# Patient Record
Sex: Female | Born: 1975 | Race: White | Hispanic: No | Marital: Single | Smoking: Current every day smoker
Health system: Southern US, Community
[De-identification: ages and names within clinical notes are randomized; demographics above are authoritative.]

## PROBLEM LIST (undated history)

## (undated) DIAGNOSIS — R87629 Unspecified abnormal cytological findings in specimens from vagina: Secondary | ICD-10-CM

## (undated) DIAGNOSIS — K759 Inflammatory liver disease, unspecified: Secondary | ICD-10-CM

## (undated) DIAGNOSIS — M1990 Unspecified osteoarthritis, unspecified site: Secondary | ICD-10-CM

## (undated) HISTORY — PX: CRYOTHERAPY: SHX1416

## (undated) HISTORY — DX: Unspecified osteoarthritis, unspecified site: M19.90

## (undated) HISTORY — DX: Unspecified abnormal cytological findings in specimens from vagina: R87.629

## (undated) HISTORY — PX: NECK SURGERY: SHX720

## (undated) HISTORY — PX: CLAVICLE SURGERY: SHX598

---

## 2007-01-07 ENCOUNTER — Emergency Department (HOSPITAL_COMMUNITY): Admission: EM | Admit: 2007-01-07 | Discharge: 2007-01-07 | Payer: Self-pay | Admitting: Emergency Medicine

## 2011-08-25 ENCOUNTER — Emergency Department (HOSPITAL_COMMUNITY)
Admission: EM | Admit: 2011-08-25 | Discharge: 2011-08-25 | Disposition: A | Discharge status: Home / Self Care | Payer: Self-pay | Attending: Emergency Medicine | Admitting: Emergency Medicine

## 2011-08-25 ENCOUNTER — Encounter (HOSPITAL_COMMUNITY): Payer: Self-pay | Admitting: *Deleted

## 2011-08-25 DIAGNOSIS — S81809A Unspecified open wound, unspecified lower leg, initial encounter: Secondary | ICD-10-CM | POA: Insufficient documentation

## 2011-08-25 DIAGNOSIS — S81009A Unspecified open wound, unspecified knee, initial encounter: Secondary | ICD-10-CM | POA: Insufficient documentation

## 2011-08-25 DIAGNOSIS — W278XXA Contact with other nonpowered hand tool, initial encounter: Secondary | ICD-10-CM | POA: Insufficient documentation

## 2011-08-25 DIAGNOSIS — S81812A Laceration without foreign body, left lower leg, initial encounter: Secondary | ICD-10-CM

## 2011-08-25 DIAGNOSIS — F17200 Nicotine dependence, unspecified, uncomplicated: Secondary | ICD-10-CM | POA: Insufficient documentation

## 2011-08-25 NOTE — ED Notes (Signed)
Superficial nick to skin on lt lower leg when shaving.No active bleeding at present.  Cut was over a vein.

## 2011-08-25 NOTE — ED Notes (Signed)
Cut leg while shaving today, states is will not stop bleeding

## 2011-08-25 NOTE — Discharge Instructions (Signed)
Leave the pressure dressing on leg until tomorrow.  Elevate leg as much as possible.  If you have any further bleeding apply direct pressure for 15-20 minutes or until it stops.

## 2011-08-25 NOTE — ED Provider Notes (Signed)
History     CSN: [id number]  Arrival date & time 08/25/11  1911   First MD Initiated Contact with Patient 08/25/11 2011      Chief Complaint  Patient presents with  . Extremity Laceration    (Consider location/radiation/quality/duration/timing/severity/associated sxs/prior treatment) HPI Comments: Cut her L lower leg while shaving.  Tied a rag around her leg and it eventually stopped.  Not on anticoagulants.  The history is provided by the patient. No language interpreter was used.    History reviewed. No pertinent past medical history.  History reviewed. No pertinent past surgical history.  No family history on file.  History  Substance Use Topics  . Smoking status: Current Everyday Smoker -- 2.0 packs/day  . Smokeless tobacco: Not on file  . Alcohol Use: Yes    OB History    Grav Para Term Preterm Abortions TAB SAB Ect Mult Living                  Review of Systems  Skin: Positive for wound.    Allergies  Review of patient's allergies indicates no known allergies.  Home Medications   Current Outpatient Rx  Name Route Sig Dispense Refill  . GOODY HEADACHE PO Oral Take 2 Packages by mouth as needed. For pain      BP 107/64  Pulse 77  Temp(Src) 97.5 F (36.4 C) (Oral)  Resp 20  Ht 5\' 9"  (1.753 m)  Wt 158 lb (71.668 kg)  BMI 23.33 kg/m2  SpO2 97%  Physical Exam  Nursing note and vitals reviewed. Constitutional: She is oriented to person, place, and time. She appears well-developed and well-nourished. No distress.  HENT:  Head: Normocephalic and atraumatic.  Eyes: EOM are normal.  Neck: Normal range of motion.  Cardiovascular: Normal rate, regular rhythm and normal heart sounds.   Pulmonary/Chest: Effort normal and breath sounds normal.  Abdominal: Soft. She exhibits no distension. There is no tenderness.  Musculoskeletal: Normal range of motion.       Legs: Neurological: She is alert and oriented to person, place, and time.  Skin: Skin is warm  and dry.  Psychiatric: She has a normal mood and affect. Judgment normal.    ED Course  Procedures (including critical care time)  Labs Reviewed - No data to display No results found.   1. Laceration of left lower leg       MDM  Leave pressure dressing on leg until tomorrow.  Direct pressure if new bleeding.        Worthy Rancher, PA 08/25/11 2103  Worthy Rancher, PA 08/25/11 2105

## 2011-08-26 NOTE — ED Provider Notes (Signed)
Medical screening examination/treatment/procedure(s) were performed by non-physician practitioner and as supervising physician I was immediately available for consultation/collaboration.  Donnetta Hutching, MD 08/26/11 1739

## 2012-08-08 ENCOUNTER — Ambulatory Visit: Payer: Self-pay | Admitting: Physician Assistant

## 2012-11-13 ENCOUNTER — Ambulatory Visit: Payer: Self-pay | Admitting: Physician Assistant

## 2012-11-16 ENCOUNTER — Ambulatory Visit: Payer: Self-pay | Admitting: Physician Assistant

## 2013-05-24 ENCOUNTER — Encounter: Payer: Self-pay | Admitting: Family Medicine

## 2013-05-24 ENCOUNTER — Ambulatory Visit (INDEPENDENT_AMBULATORY_CARE_PROVIDER_SITE_OTHER): Payer: Medicaid Other | Admitting: Family Medicine

## 2013-05-24 VITALS — BP 128/88 | HR 83 | Temp 99.2°F | Ht 69.0 in | Wt 162.0 lb

## 2013-05-24 DIAGNOSIS — I872 Venous insufficiency (chronic) (peripheral): Secondary | ICD-10-CM

## 2013-05-24 DIAGNOSIS — L0390 Cellulitis, unspecified: Secondary | ICD-10-CM

## 2013-05-24 DIAGNOSIS — R609 Edema, unspecified: Secondary | ICD-10-CM

## 2013-05-24 DIAGNOSIS — L0291 Cutaneous abscess, unspecified: Secondary | ICD-10-CM

## 2013-05-24 MED ORDER — SULFAMETHOXAZOLE-TMP DS 800-160 MG PO TABS: 1.0000 | ORAL_TABLET | Freq: Two times a day (BID) | ORAL | Status: DC

## 2013-05-24 NOTE — Patient Instructions (Signed)
Abscess An abscess is an infected area that contains a collection of pus and debris.It can occur in almost any part of the body. An abscess is also known as a furuncle or boil. CAUSES  An abscess occurs when tissue gets infected. This can occur from blockage of oil or sweat glands, infection of hair follicles, or a minor injury to the skin. As the body tries to fight the infection, pus collects in the area and creates pressure under the skin. This pressure causes pain. People with weakened immune systems have difficulty fighting infections and get certain abscesses more often.  SYMPTOMS Usually an abscess develops on the skin and becomes a painful mass that is red, warm, and tender. If the abscess forms under the skin, you may feel a moveable soft area under the skin. Some abscesses break open (rupture) on their own, but most will continue to get worse without care. The infection can spread deeper into the body and eventually into the bloodstream, causing you to feel ill.  DIAGNOSIS  Your caregiver will take your medical history and perform a physical exam. A sample of fluid may also be taken from the abscess to determine what is causing your infection. TREATMENT  Your caregiver may prescribe antibiotic medicines to fight the infection. However, taking antibiotics alone usually does not cure an abscess. Your caregiver may need to make a small cut (incision) in the abscess to drain the pus. In some cases, gauze is packed into the abscess to reduce pain and to continue draining the area. HOME CARE INSTRUCTIONS   Only take over-the-counter or prescription medicines for pain, discomfort, or fever as directed by your caregiver.  If you were prescribed antibiotics, take them as directed. Finish them even if you start to feel better.  If gauze is used, follow your caregiver's directions for changing the gauze.  To avoid spreading the infection:  Keep your draining abscess covered with a  bandage.  Wash your hands well.  Do not share personal care items, towels, or whirlpools with others.  Avoid skin contact with others.  Keep your skin and clothes clean around the abscess.  Keep all follow-up appointments as directed by your caregiver. SEEK MEDICAL CARE IF:   You have increased pain, swelling, redness, fluid drainage, or bleeding.  You have muscle aches, chills, or a general ill feeling.  You have a fever. MAKE SURE YOU:   Understand these instructions.  Will watch your condition.  Will get help right away if you are not doing well or get worse. Document Released: 02/10/2005 Document Revised: 11/02/2011 Document Reviewed: 07/16/2011 Exit[employer] Patient Information 2014 [employer].  

## 2013-05-24 NOTE — Progress Notes (Signed)
   Subjective:    Patient ID: Denise MannersHeather Conner, female    DOB: 09/09/1975, 38 y.o.   MRN: [id number]  HPI  This 38 y.o. female presents for evaluation of left leg abscess and lower extremity edema. She is having some discomfort in her lower extremities and she is having problems with varicose Veins and swelling.  She states that her legs get swollen during the day and turn red.  She smokes 2 ppd of cigarettes.  Review of Systems C/o lower extremity edema and left leg abscess. No chest pain, SOB, HA, dizziness, vision change, N/V, diarrhea, constipation, dysuria, urinary urgency or frequency, myalgias, arthralgias or rash.     Objective:   Physical Exam  Vital signs noted  Well developed well nourished female.  HEENT - Head atraumatic Normocephalic                Eyes - PERRLA, Conjuctiva - clear Sclera- Clear EOMI                Ears - EAC's Wnl TM's Wnl Gross Hearing WNL                Nose - Nares patent                 Throat - oropharanx wnl Respiratory - Lungs CTA bilateral Cardiac - RRR S1 and S2 without murmur GI - Abdomen soft Nontender and bowel sounds active x 4 Extremities - Venous stasis dermatitis and varicose veins Neuro - Grossly intact. Skin - Left distal inner thigh proximal to her left knee with erythema and approximately 6cm induration W/o fluctuance. She has tenderness in this region.  She has bandaids which have purulent drainage On them.     Assessment & Plan:  Edema - Plan: Ambulatory referral to Vascular Surgery, CANCELED: COMPRESSION STOCKINGS-MEASURE,APPLY AND TEACH  Chronic venous insufficiency - Plan: Ambulatory referral to Vascular Surgery, CANCELED: COMPRESSION STOCKINGS-MEASURE,APPLY AND TEACH  Abscess - Plan: sulfamethoxazole-trimethoprim (BACTRIM DS) 800-160 MG per tablet bid x 14 days And follow up if not better.  Tobacco abuse - Discussed she needs to cut back and quit and this is not helping her legs.  Deatra CanterWilliam J Vashti Bolanos FNP

## 2013-05-29 ENCOUNTER — Other Ambulatory Visit: Payer: Self-pay | Admitting: *Deleted

## 2013-05-29 ENCOUNTER — Ambulatory Visit: Payer: Medicaid Other | Admitting: Family Medicine

## 2013-05-29 DIAGNOSIS — L539 Erythematous condition, unspecified: Secondary | ICD-10-CM

## 2013-05-29 DIAGNOSIS — M7989 Other specified soft tissue disorders: Secondary | ICD-10-CM

## 2013-06-01 ENCOUNTER — Ambulatory Visit: Payer: Medicaid Other | Admitting: Family Medicine

## 2013-06-04 ENCOUNTER — Ambulatory Visit: Payer: Medicaid Other | Admitting: General Practice

## 2013-06-27 ENCOUNTER — Telehealth: Payer: Self-pay | Admitting: Family Medicine

## 2013-06-28 ENCOUNTER — Encounter: Payer: Self-pay | Admitting: Vascular Surgery

## 2013-06-29 ENCOUNTER — Inpatient Hospital Stay (HOSPITAL_COMMUNITY): Admission: RE | Admit: 2013-06-29 | Payer: Self-pay | Source: Ambulatory Visit

## 2013-06-29 ENCOUNTER — Encounter: Payer: Self-pay | Admitting: Vascular Surgery

## 2013-07-23 ENCOUNTER — Other Ambulatory Visit: Payer: Medicaid Other | Admitting: Nurse Practitioner

## 2013-08-13 ENCOUNTER — Telehealth: Payer: Self-pay | Admitting: General Practice

## 2013-10-03 ENCOUNTER — Encounter: Payer: Self-pay | Admitting: Physician Assistant

## 2013-10-03 ENCOUNTER — Ambulatory Visit (INDEPENDENT_AMBULATORY_CARE_PROVIDER_SITE_OTHER): Payer: Medicaid Other | Admitting: Physician Assistant

## 2013-10-03 VITALS — BP 125/83 | HR 78 | Temp 98.0°F | Ht 69.0 in | Wt 158.0 lb

## 2013-10-03 DIAGNOSIS — I8390 Asymptomatic varicose veins of unspecified lower extremity: Secondary | ICD-10-CM | POA: Insufficient documentation

## 2013-10-03 NOTE — Progress Notes (Signed)
Subjective:     Patient ID: Joetta MannersHeather Stevick, female   DOB: 07/17/1975, 38 y.o.   MRN: [id number]  HPI Pt here for f/u of chronic problem of lower ext edema and varicose vein Prev seen for same and referral made Due to illness or family member was unable to make appt  Review of Systems + pain and swelling to the bilat lower ext Sx worse with prolonged standing Denies numbness     Objective:   Physical Exam Mult varicosities noted to the lower ext bilat No erythema/edema noted today Sl TTP over some of the prom veins Good pulses/sensory    Assessment:     Lower ext edema Varicose veins    Plan:     Referral to vein spec Stressed importance of making appt OTC compression hose when at work F/U prn

## 2013-10-25 ENCOUNTER — Encounter: Payer: Self-pay | Admitting: General Practice

## 2013-10-25 ENCOUNTER — Ambulatory Visit (INDEPENDENT_AMBULATORY_CARE_PROVIDER_SITE_OTHER): Payer: Medicaid Other

## 2013-10-25 ENCOUNTER — Ambulatory Visit (INDEPENDENT_AMBULATORY_CARE_PROVIDER_SITE_OTHER): Payer: Medicaid Other | Admitting: General Practice

## 2013-10-25 DIAGNOSIS — R079 Chest pain, unspecified: Secondary | ICD-10-CM

## 2013-10-25 DIAGNOSIS — R0781 Pleurodynia: Secondary | ICD-10-CM
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
## 2013-10-27 ENCOUNTER — Other Ambulatory Visit: Payer: Self-pay | Admitting: General Practice

## 2013-10-27 DIAGNOSIS — S2231XA Fracture of one rib, right side, initial encounter for closed fracture: Secondary | ICD-10-CM

## 2013-10-27 DIAGNOSIS — S2242XA Multiple fractures of ribs, left side, initial encounter for closed fracture: Secondary | ICD-10-CM

## 2013-10-27 MED ORDER — HYDROCODONE-ACETAMINOPHEN 5-325 MG PO TABS: 1.0000 | ORAL_TABLET | Freq: Four times a day (QID) | ORAL | Status: DC | PRN

## 2013-12-06 ENCOUNTER — Encounter: Payer: Self-pay | Admitting: Vascular Surgery

## 2013-12-07 ENCOUNTER — Encounter: Payer: Self-pay | Admitting: Vascular Surgery

## 2013-12-07 ENCOUNTER — Encounter (HOSPITAL_COMMUNITY): Payer: Self-pay

## 2013-12-17 ENCOUNTER — Ambulatory Visit: Payer: Medicaid Other | Admitting: Family Medicine

## 2014-07-16 IMAGING — CR DG RIBS 2V*L*
2 series · 2 of 2 positions shown · non-contrast
Comparison: None.

CLINICAL DATA: Fall.  Left lower rib pain.

LEFT RIBS - 2 VIEW

[view not recorded (1 of 2)]
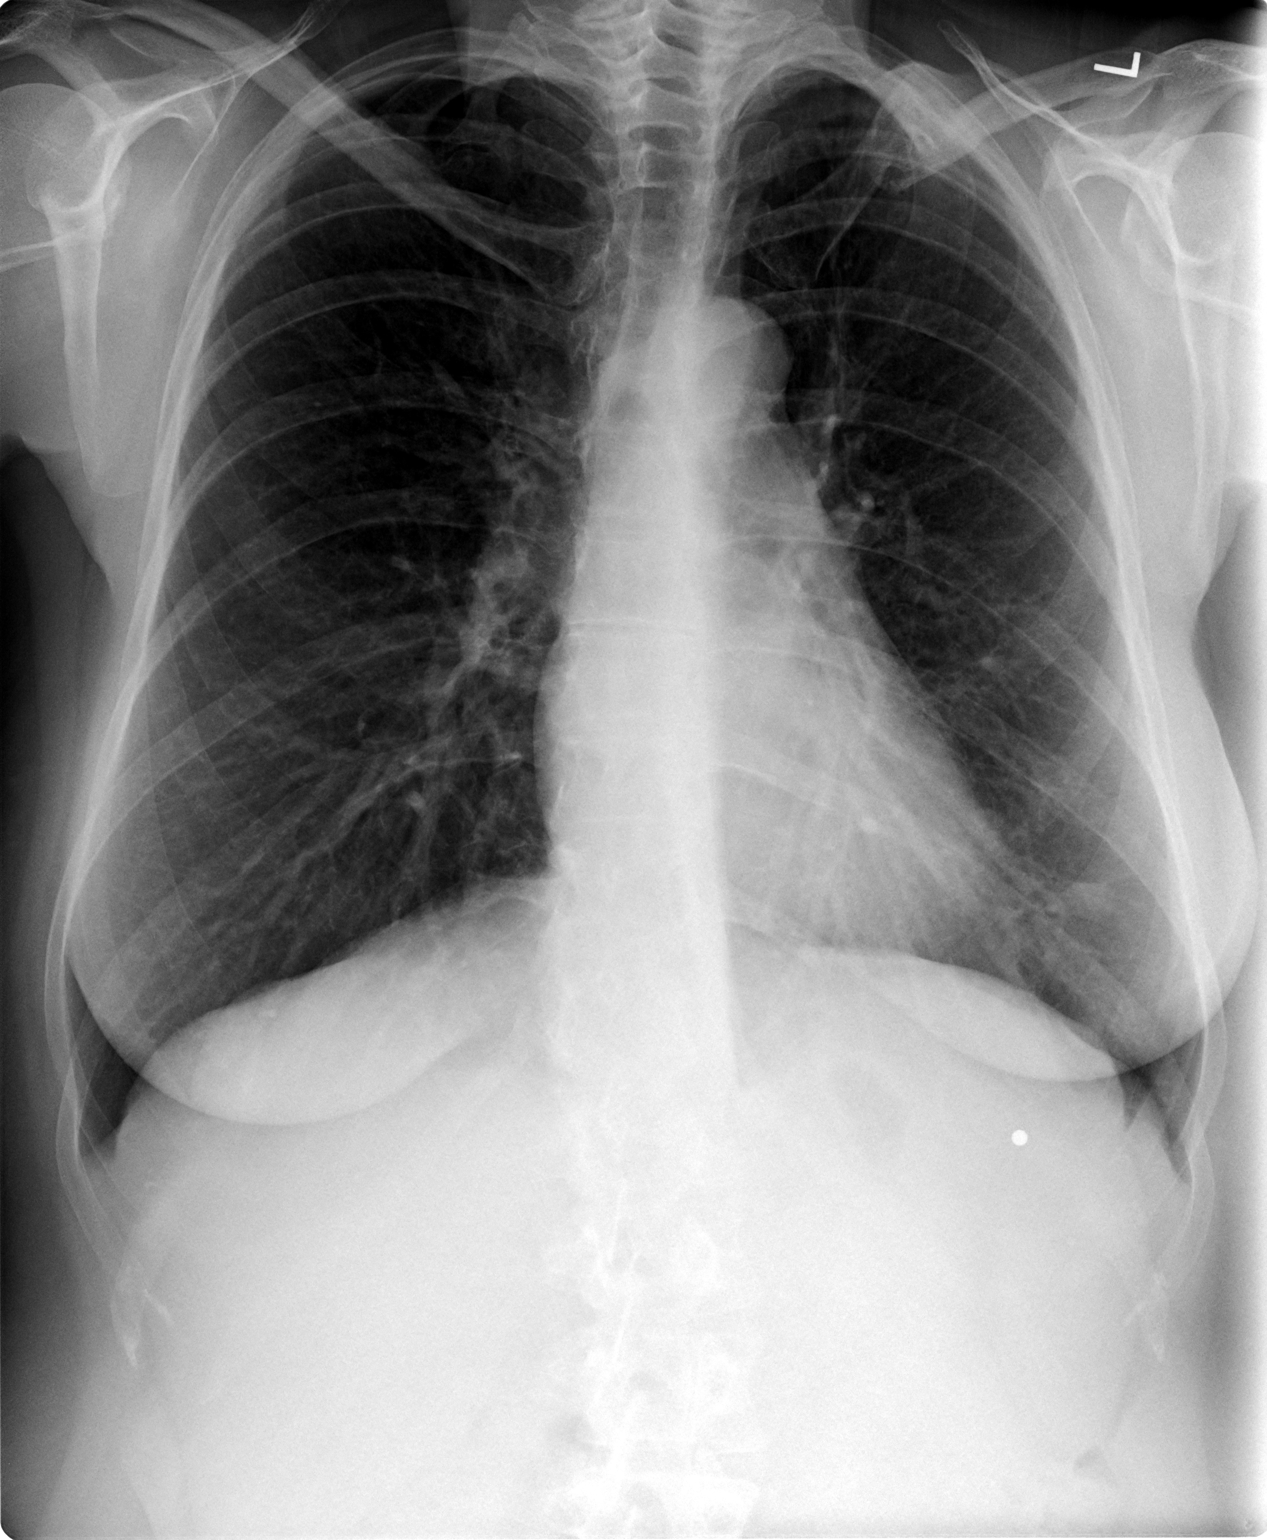

[view not recorded (2 of 2)]
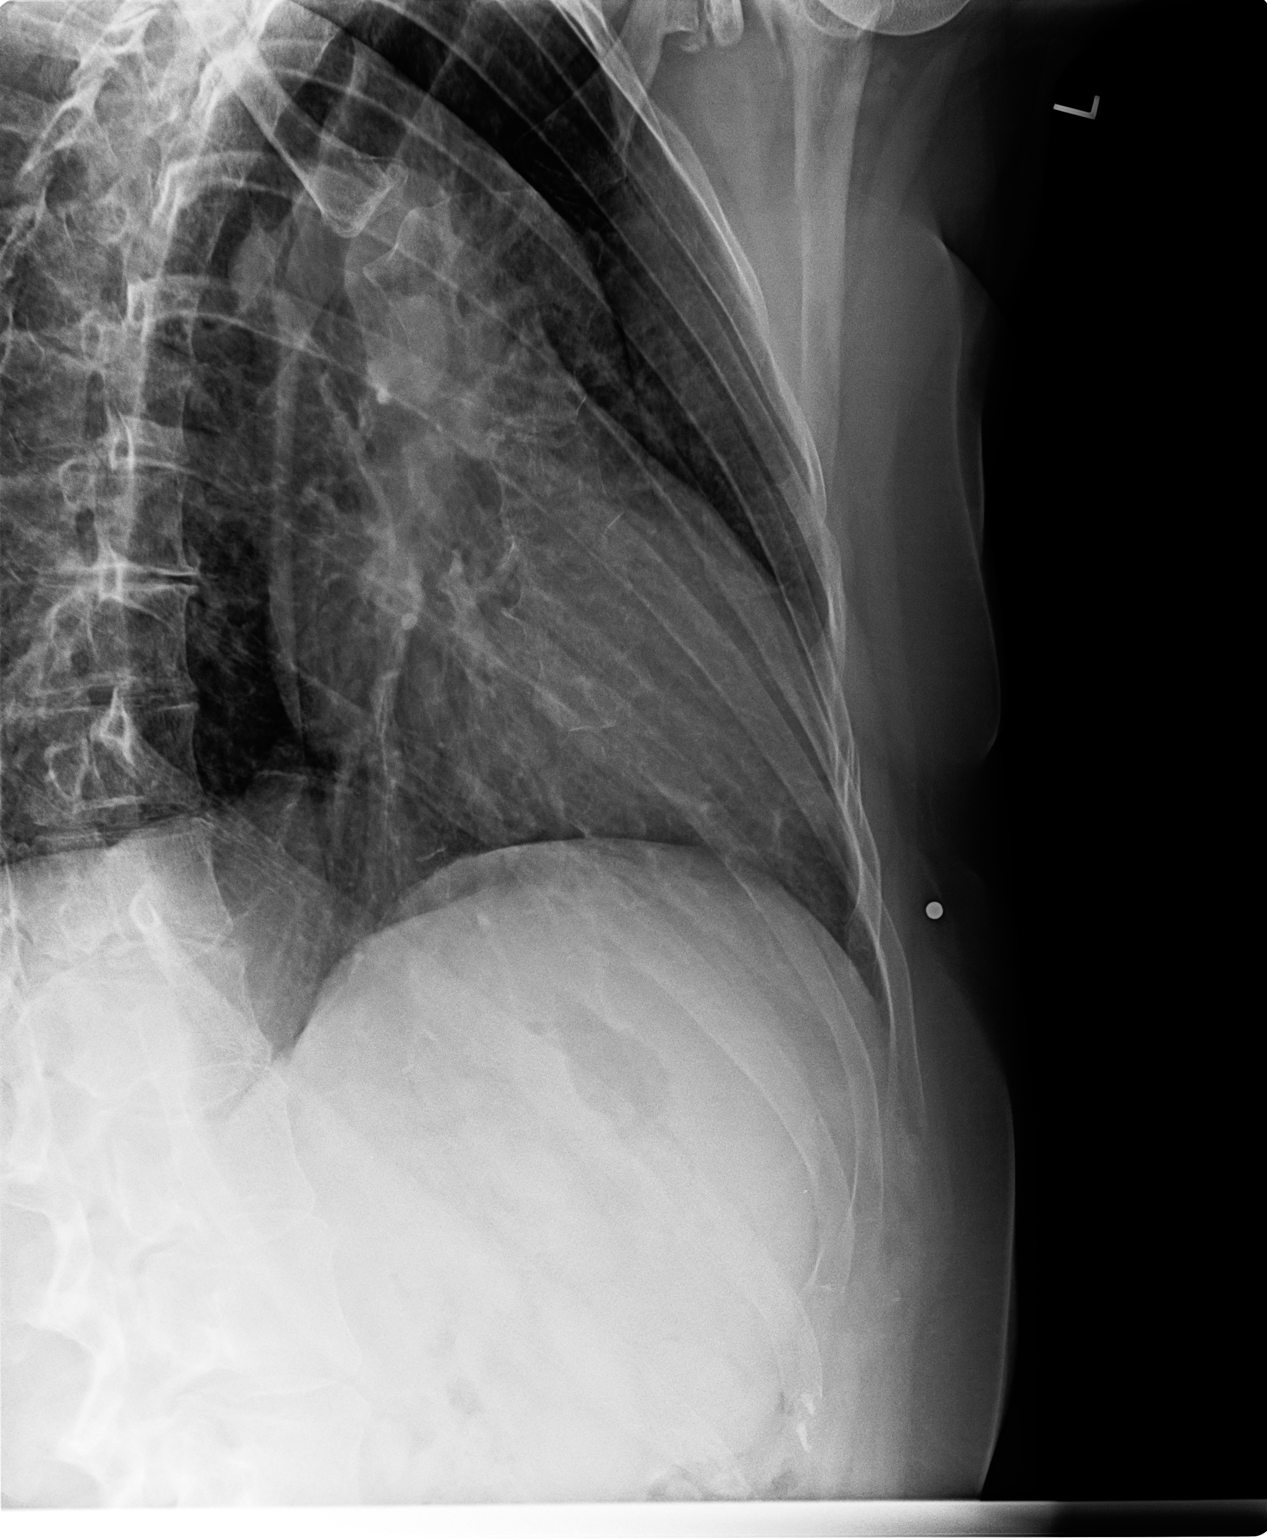

[2 of 2 positions shown; findings below may reference images not displayed]

FINDINGS: The heart, mediastinal, hilar contours are normal.  No
focal airspace opacity, effusion, or pneumothorax is identified.

A metallic skin marker denotes site of patient pain.  There is an
acute fracture of the lateral arc of a single left rib. This is
visualized on the oblique view.  This is favored to be the left
fifth rib.

A right cervical rib is noted.  There is a remote healed left mid
clavicle fracture.
IMPRESSION: 1.  Acute fracture of the lateral arc of the left rib (likely the
fifth rib).
2.  Right cervical rib.
3.  Remote healed left clavicle fracture.

## 2014-08-16 ENCOUNTER — Telehealth: Payer: Self-pay | Admitting: Family Medicine

## 2014-08-22 NOTE — Telephone Encounter (Signed)
Several attempts have been made to contact patient. Detailed message left to call us back if still needs us but if not she may disregard this message.

## 2015-12-25 IMAGING — CR DG RIBS W/ CHEST 3+V*L*
2 series · 2 of 2 positions shown · non-contrast
Comparison: none

[view not recorded (1 of 2)]
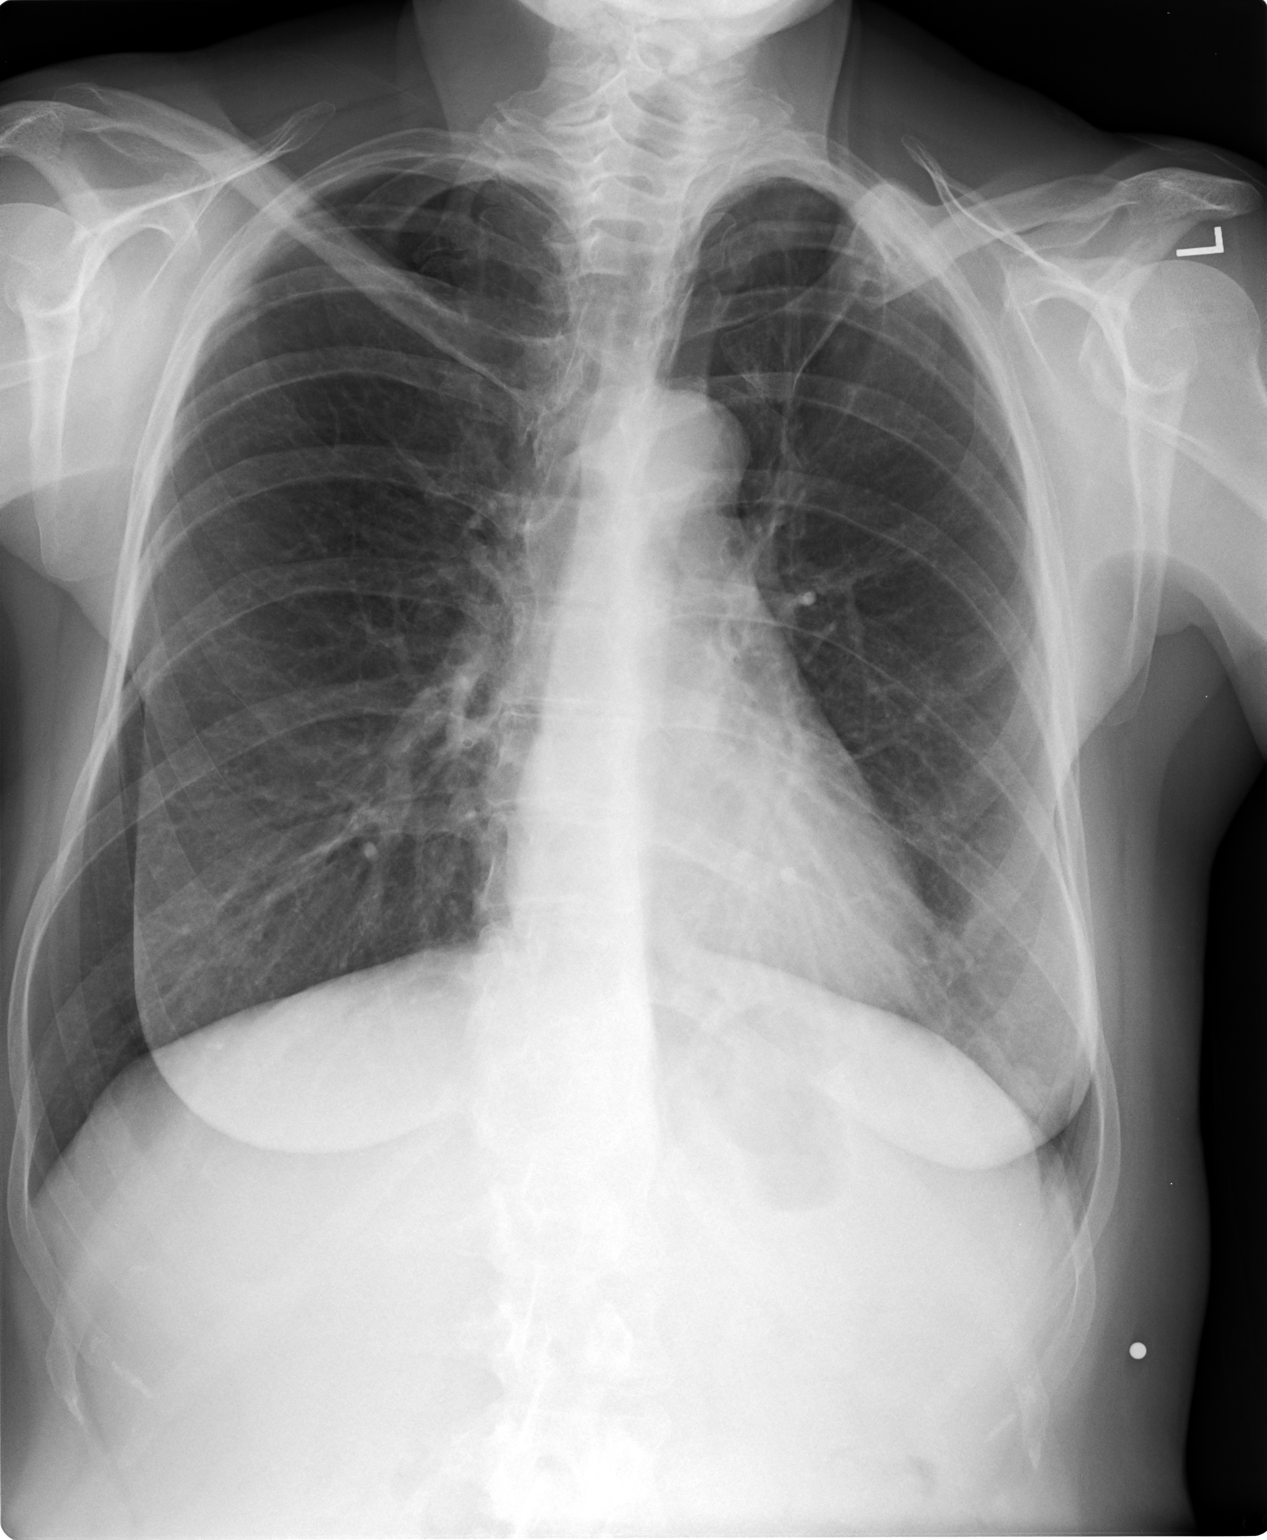

[view not recorded (2 of 2)]
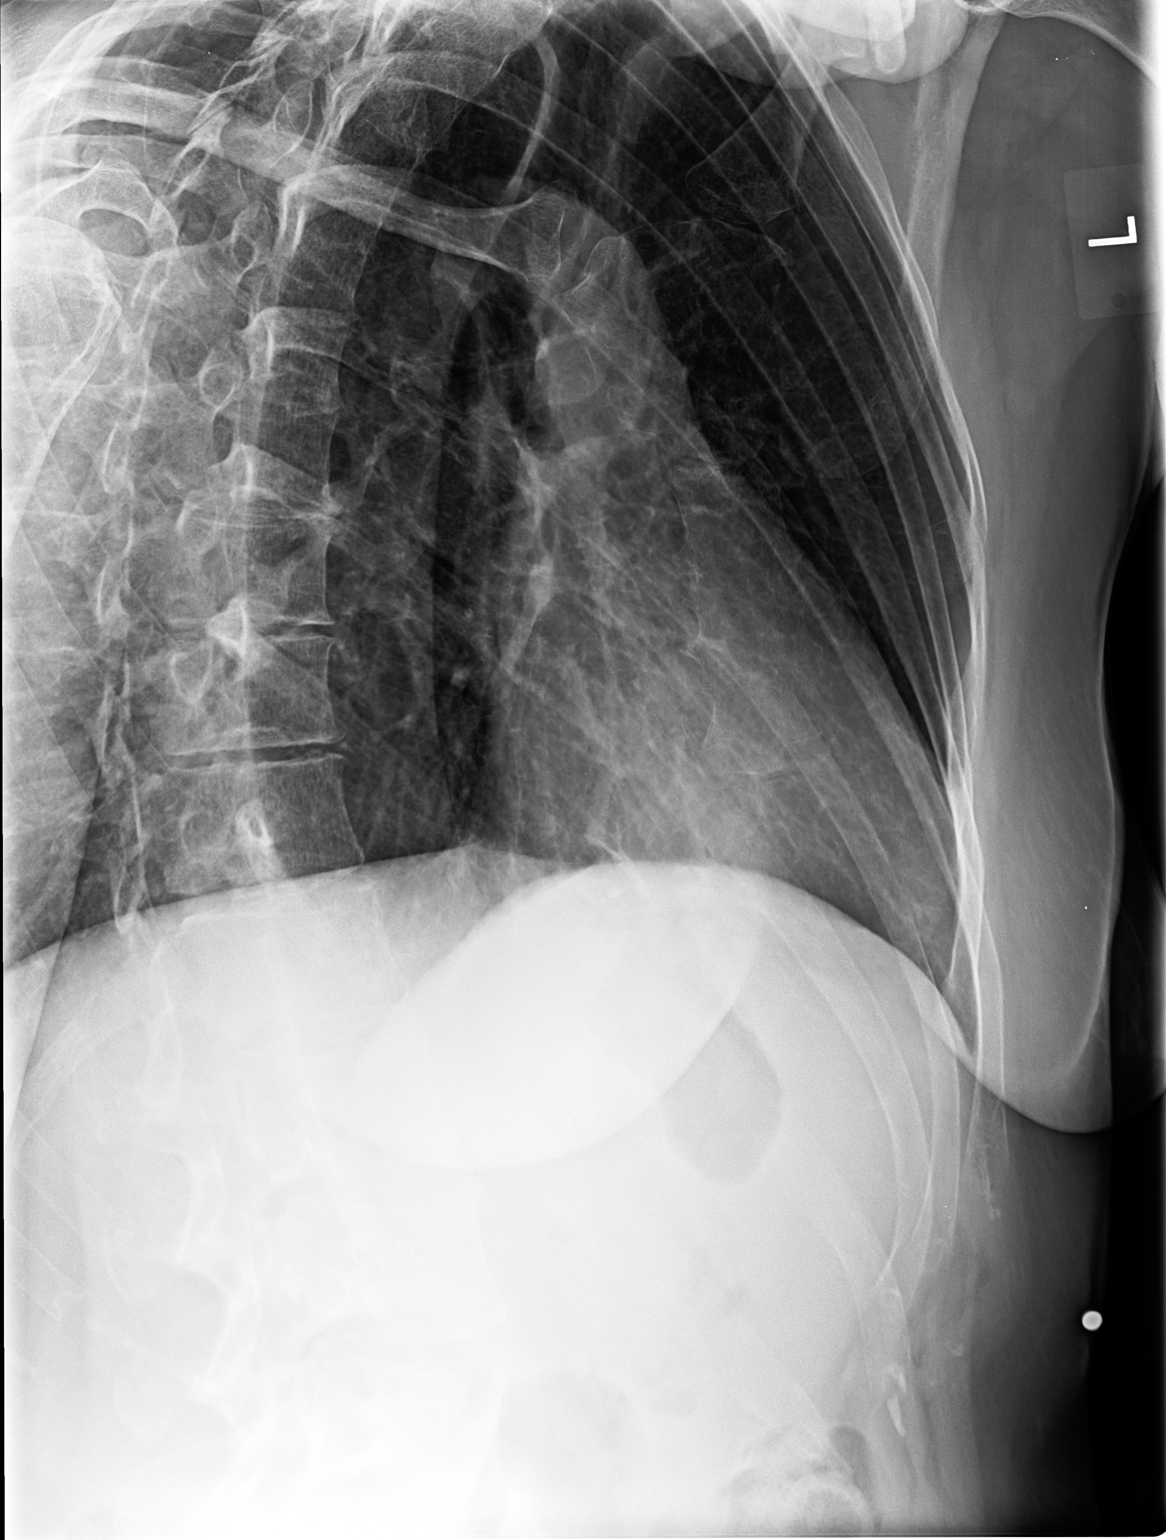

[2 of 2 positions shown; findings below may reference images not displayed]

Canned report from images found in remote index.

Refer to host system for actual result text.

## 2016-11-15 DIAGNOSIS — F329 Major depressive disorder, single episode, unspecified: Secondary | ICD-10-CM | POA: Insufficient documentation

## 2019-09-12 ENCOUNTER — Ambulatory Visit: Payer: Self-pay

## 2019-10-10 ENCOUNTER — Ambulatory Visit: Payer: Self-pay | Attending: Internal Medicine

## 2019-10-10 DIAGNOSIS — Z23 Encounter for immunization: Secondary | ICD-10-CM

## 2019-10-10 NOTE — Progress Notes (Signed)
   Covid-19 Vaccination Clinic  Name:  Denise Conner    MRN: [id number] DOB: 01-Jun-1975  10/10/2019  [name] was observed post Covid-19 immunization for 15 minutes without incident. She was provided with Vaccine Information Sheet and instruction to access the V-Safe system.   [name] was instructed to call 911 with any severe reactions post vaccine: Marland Kitchen Difficulty breathing  . Swelling of face and throat  . A fast heartbeat  . A bad rash all over body  . Dizziness and weakness   Immunizations Administered    Name Date Dose VIS Date Route   Moderna COVID-19 Vaccine 10/10/2019  1:09 PM 0.5 mL 04/2019 Intramuscular   Manufacturer: Gala Murdoch   Lot: [id number]   NDC: [id number]

## 2020-03-04 ENCOUNTER — Other Ambulatory Visit: Payer: Self-pay

## 2020-03-06 ENCOUNTER — Ambulatory Visit (INDEPENDENT_AMBULATORY_CARE_PROVIDER_SITE_OTHER): Payer: Medicaid Other | Admitting: Adult Health

## 2020-03-06 ENCOUNTER — Encounter: Payer: Self-pay | Admitting: Adult Health

## 2020-03-06 VITALS — BP 130/81 | HR 81 | Ht 67.5 in | Wt 183.5 lb

## 2020-03-06 DIAGNOSIS — O09529 Supervision of elderly multigravida, unspecified trimester: Secondary | ICD-10-CM | POA: Insufficient documentation

## 2020-03-06 DIAGNOSIS — O0932 Supervision of pregnancy with insufficient antenatal care, second trimester: Secondary | ICD-10-CM | POA: Diagnosis not present

## 2020-03-06 DIAGNOSIS — Z3201 Encounter for pregnancy test, result positive: Secondary | ICD-10-CM | POA: Insufficient documentation

## 2020-03-06 DIAGNOSIS — O09522 Supervision of elderly multigravida, second trimester: Secondary | ICD-10-CM | POA: Diagnosis not present

## 2020-03-06 DIAGNOSIS — F1110 Opioid abuse, uncomplicated: Secondary | ICD-10-CM

## 2020-03-06 DIAGNOSIS — Z363 Encounter for antenatal screening for malformations: Secondary | ICD-10-CM | POA: Diagnosis not present

## 2020-03-06 DIAGNOSIS — F17200 Nicotine dependence, unspecified, uncomplicated: Secondary | ICD-10-CM | POA: Insufficient documentation

## 2020-03-06 LAB — POCT URINE PREGNANCY: Preg Test, Ur: POSITIVE — AB
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
## 2020-03-13 ENCOUNTER — Other Ambulatory Visit: Payer: Self-pay | Admitting: Adult Health

## 2020-03-13 ENCOUNTER — Ambulatory Visit (INDEPENDENT_AMBULATORY_CARE_PROVIDER_SITE_OTHER): Payer: Medicaid Other

## 2020-03-13 DIAGNOSIS — Z363 Encounter for antenatal screening for malformations: Secondary | ICD-10-CM

## 2020-03-13 DIAGNOSIS — Z3A22 22 weeks gestation of pregnancy: Secondary | ICD-10-CM

## 2020-03-13 DIAGNOSIS — O4402 Placenta previa specified as without hemorrhage, second trimester: Secondary | ICD-10-CM | POA: Diagnosis not present

## 2020-03-13 NOTE — Progress Notes (Signed)
[place] TA/TV: 26+1 wks,single IUP,cephalic,svp of fluid 5.2 CM,CX 3.6 cm,complete placenta previa,posterior placenta gr 0,normal ovaries,fhr 117 bpm,EFW 821 g,anatomy complete,EDD 06/18/2020 by today's ultrasound,discussed results with Fran,pt will come back next wk for doctors appt, pelvic rest,per Drenda Freeze.

## 2020-03-19 ENCOUNTER — Encounter: Payer: Self-pay | Admitting: Women's Health

## 2020-03-19 DIAGNOSIS — F1120 Opioid dependence, uncomplicated: Secondary | ICD-10-CM | POA: Insufficient documentation

## 2020-03-19 DIAGNOSIS — O0990 Supervision of high risk pregnancy, unspecified, unspecified trimester: Secondary | ICD-10-CM | POA: Insufficient documentation

## 2020-03-19 DIAGNOSIS — O26899 Other specified pregnancy related conditions, unspecified trimester: Secondary | ICD-10-CM | POA: Insufficient documentation

## 2020-03-19 DIAGNOSIS — Z6791 Unspecified blood type, Rh negative: Secondary | ICD-10-CM | POA: Insufficient documentation

## 2020-03-21 ENCOUNTER — Encounter: Payer: Self-pay | Admitting: Women's Health

## 2020-03-21 ENCOUNTER — Ambulatory Visit (INDEPENDENT_AMBULATORY_CARE_PROVIDER_SITE_OTHER): Payer: Medicaid Other | Admitting: Women's Health

## 2020-03-21 VITALS — BP 116/74 | HR 78 | Wt 187.0 lb

## 2020-03-21 DIAGNOSIS — O09522 Supervision of elderly multigravida, second trimester: Secondary | ICD-10-CM

## 2020-03-21 DIAGNOSIS — Z23 Encounter for immunization: Secondary | ICD-10-CM | POA: Diagnosis not present

## 2020-03-21 DIAGNOSIS — O2342 Unspecified infection of urinary tract in pregnancy, second trimester: Secondary | ICD-10-CM

## 2020-03-21 DIAGNOSIS — O0992 Supervision of high risk pregnancy, unspecified, second trimester: Secondary | ICD-10-CM | POA: Diagnosis not present

## 2020-03-21 DIAGNOSIS — O99320 Drug use complicating pregnancy, unspecified trimester: Secondary | ICD-10-CM

## 2020-03-21 DIAGNOSIS — O4402 Placenta previa specified as without hemorrhage, second trimester: Secondary | ICD-10-CM

## 2020-03-21 DIAGNOSIS — Z6791 Unspecified blood type, Rh negative: Secondary | ICD-10-CM

## 2020-03-21 DIAGNOSIS — O4400 Placenta previa specified as without hemorrhage, unspecified trimester: Secondary | ICD-10-CM | POA: Insufficient documentation

## 2020-03-21 DIAGNOSIS — F1110 Opioid abuse, uncomplicated: Secondary | ICD-10-CM

## 2020-03-21 DIAGNOSIS — Z1389 Encounter for screening for other disorder: Secondary | ICD-10-CM

## 2020-03-21 DIAGNOSIS — F17200 Nicotine dependence, unspecified, uncomplicated: Secondary | ICD-10-CM

## 2020-03-21 DIAGNOSIS — O26899 Other specified pregnancy related conditions, unspecified trimester: Secondary | ICD-10-CM

## 2020-03-21 DIAGNOSIS — Z331 Pregnant state, incidental: Secondary | ICD-10-CM

## 2020-03-21 DIAGNOSIS — O0932 Supervision of pregnancy with insufficient antenatal care, second trimester: Secondary | ICD-10-CM

## 2020-03-21 DIAGNOSIS — F1120 Opioid dependence, uncomplicated: Secondary | ICD-10-CM

## 2020-03-21 DIAGNOSIS — Z3A24 24 weeks gestation of pregnancy: Secondary | ICD-10-CM

## 2020-03-21 DIAGNOSIS — Z98891 History of uterine scar from previous surgery: Secondary | ICD-10-CM

## 2020-03-21 LAB — POCT URINALYSIS DIPSTICK OB
Blood, UA: NEGATIVE
Glucose, UA: NEGATIVE
Ketones, UA: NEGATIVE
Nitrite, UA: POSITIVE
POC,PROTEIN,UA: NEGATIVE

## 2020-03-21 MED ORDER — NITROFURANTOIN MONOHYD MACRO 100 MG PO CAPS: 100.0000 mg | ORAL_CAPSULE | Freq: Two times a day (BID) | ORAL | 0 refills | Status: DC

## 2020-03-21 MED ORDER — BLOOD PRESSURE MONITOR MISC: 0 refills | Status: DC

## 2020-03-21 NOTE — Progress Notes (Signed)
INITIAL OBSTETRICAL VISIT Patient name: Denise Conner MRN [id number]  Date of birth: April 29, 1976 Chief Complaint:   Initial Prenatal Visit (Right lower back pain, cramping)  History of Present Illness:   Denise Conner is a 44 y.o. G18P2012 Caucasian female at [redacted]w[redacted]d by [place] at 65 weeks with an Estimated Date of Delivery: 06/18/20 being seen today for her initial obstetrical visit.   Her obstetrical history is significant for SVB x 1, then scheduled PCS @ 37wks (76yrs ago) for oligo/FGR, AB x 1 Chronic headaches> was taking 4 BC powders daily, has cut down to 2/day Suboxone 2 strips/day- off street Oxycodone 10mg  3-4/day- off street (if can't get suboxone) Smoker: 2ppd prior to pregnancy, now 1ppd, wants to quit Complete posterior previa dx @ 26wk u/s, no bleeding Today she reports urinary frequency, no fever/chills, some Rt lower back pain, afebrile.  Depression screen PHQ 2/9 03/21/2020  Decreased Interest 1  Down, Depressed, Hopeless 0  PHQ - 2 Score 1  Altered sleeping 2  Tired, decreased energy 3  Change in appetite 0  Feeling bad or failure about yourself  0  Trouble concentrating 1  Moving slowly or fidgety/restless 0  Suicidal thoughts 0  PHQ-9 Score 7    No LMP recorded (lmp unknown). Patient is pregnant. Last pap 10/18/17. Results were: normal Review of Systems:   Pertinent items are noted in HPI Denies cramping/contractions, leakage of fluid, vaginal bleeding, abnormal vaginal discharge w/ itching/odor/irritation, headaches, visual changes, shortness of breath, chest pain, abdominal pain, severe nausea/vomiting, or problems with urination or bowel movements unless otherwise stated above.  Pertinent History Reviewed:  Reviewed past medical,surgical, social, obstetrical and family history.  Reviewed problem list, medications and allergies. OB History  Gravida Para Term Preterm AB Living  4 2 2   1 2   SAB TAB Ectopic Multiple Live Births          2    # Outcome Date GA Lbr  Len/2nd Weight Sex Delivery Anes PTL Lv  4 Current           3 AB 2020          2 Term 01/13/18 [redacted]w[redacted]d  4 lb 2 oz (1.871 kg) M CS-LTranv  N LIV     Complications: IUGR (intrauterine growth retardation) of newborn  1 Term 12/15/00 [redacted]w[redacted]d  8 lb 2 oz (3.685 kg) M Vag-Spont EPI N LIV   Physical Assessment:   Vitals:   03/21/20 0912  BP: 116/74  Pulse: 78  Weight: 187 lb (84.8 kg)  Body mass index is 28.86 kg/m.       Physical Examination:  General appearance - well appearing, and in no distress  Mental status - alert, oriented to person, place, and time  Psych:  She has a normal mood and affect  Skin - warm and dry, normal color, no suspicious lesions noted  Chest - effort normal, all lung fields clear to auscultation bilaterally  Heart - normal rate and regular rhythm  Abdomen - soft, nontender  Extremities:  No swelling or varicosities noted  Thin prep pap is not done   Chaperone: N/A    TODAY'S FH: 26cm FHT: 127 via doppler  Results for orders placed or performed in visit on 03/21/20 (from the past 24 hour(s))  POC Urinalysis Dipstick OB   Collection Time: 03/21/20  9:12 AM  Result Value Ref Range   Color, UA     Clarity, UA     Glucose, UA Negative Negative  Bilirubin, UA     Ketones, UA neg    Spec Grav, UA     Blood, UA neg    pH, UA     POC,PROTEIN,UA Negative Negative, Trace, Small (1+), Moderate (2+), Large (3+), 4+   Urobilinogen, UA     Nitrite, UA positive    Leukocytes, UA Moderate (2+) (A) Negative   Appearance     Odor      Assessment & Plan:  1) High-Risk Pregnancy [id number] at [redacted]w[redacted]d with an Estimated Date of Delivery: 06/18/20   2) Initial OB visit  3) AMA 44yo  4) Late prenatal care  5) Complete posterior previa> @ 26wks, no bleeding, pelvic rest, reviewed warning s/s, reasons to seek care  5) Suboxone and oxycodone off street> gave #s to local clinics, call today   6) ASA use> was taking 4 BC powders daily, now 2/daily- cut out completely,  then will start baby ASA  7) Chronic headaches> gave printed prevention/relief measures   8) Prev c/s> SVB then scheduled C/S, wants RCS  9) Smoker> Smokes 1pp/day down from 2ppd, counseled x 3-30mins, advised cessation, discussed risks to fetus while pregnant, to infant pp, and to herself. Offered QuitlineNC, accepted, referral sent.     10) Wants BTL> reviewed risks/benefits, LARCs just as effective, consent signed today   11) UTI> rx macrobid, send cx  Meds:  Meds ordered this encounter  Medications  . Blood Pressure Monitor MISC    Sig: For regular home bp monitoring during pregnancy    Dispense:  1 each    Refill:  0    O09.90  . nitrofurantoin, macrocrystal-monohydrate, (MACROBID) 100 MG capsule    Sig: Take 1 capsule (100 mg total) by mouth 2 (two) times daily. X 7 days    Dispense:  14 capsule    Refill:  0    Order Specific Question:   Supervising Provider    Answer:   Duane Lope H [[id number]]    Initial labs obtained Continue prenatal vitamins Reviewed n/v relief measures and warning s/s to report Reviewed recommended weight gain based on pre-gravid BMI Encouraged well-balanced diet Genetic & carrier screening discussed: requests Panorama and Horizon 14 , too late for NT/IT and AFP Ultrasound discussed; fetal survey: results reviewed [facility]NC completed> form faxed if has or is planning to apply for medicaid The nature of [facility] [employer] with multiple MDs and other Advanced Practice Providers was explained to patient; also emphasized that fellows, residents, and students are part of our team. Does not have home bp cuff. Rx faxed to [facility]. Check bp weekly, let us know if >140/90.  Flu & tdap shots today  Follow-up: Return for ASAP PN2 (no visit), then 3wks EFW u/s and HROB w/ MD only, Sign BTL consent today.   Orders Placed This Encounter  Procedures  . Urine Culture  . GC/Chlamydia Probe Amp  . Tdap vaccine greater than or equal to 7yo IM  .  Flu Vaccine QUAD 36+ mos IM  . Genetic Screening  . Pain Management Screening Profile (10S)  . CBC/D/Plt+RPR+Rh+ABO+Rub Ab...  . POC Urinalysis Dipstick OB    [name] CNM, [facility] 03/21/2020 10:40 AM

## 2020-03-21 NOTE — Patient Instructions (Addendum)
Denise Conner, I greatly value your feedback.  If you receive a survey following your visit with [place] today, we appreciate you taking the time to fill it out.  Thanks, Denise Conner, CNM, WHNP-BC   You will have your sugar test next visit.  Please do not eat or drink anything after midnight the night before you come, not even water.  You will be here for at least two hours.  Please make an appointment online for the bloodwork at [email or web address] for 8:30am (or as close to this as possible). Make sure you select the [facility] service center. The day of the appointment, check in with our office first, then you will go to [facility] to start the sugar test.    [facility] [place], [place] [address]) Entrance C, located off of [address] [employer] [address] valet parking   [facility]  . [employer] [place]   [address] [place], [phone]  . [facility], Dr. Cathey Endow  [address] [place] (beside [address]) [phone]  . [facility]  [address] [place], [phone]  For Headaches:   Stay well hydrated, drink enough water so that your urine is clear, sometimes if you are dehydrated you can get headaches  Eat small frequent meals and snacks, sometimes if you are hungry you can get headaches  Sometimes you get headaches during pregnancy from the pregnancy hormones  You can try tylenol (1-2 regular strength 325mg  or 1-2 extra strength 500mg ) as directed on the box. The least amount of medication that works is best.   Cool compresses (cool wet washcloth or ice pack) to area of head that is hurting  You can also try drinking a caffeinated drink to see if this will help  If not helping, try below:  For Prevention of Headaches/Migraines:  CoQ10 100mg  three times daily  Vitamin B2 400mg  daily  Magnesium Oxide 400-600mg  daily  If You Get a Bad  Headache/Migraine:  Benadryl 25mg    Magnesium Oxide  1 large Gatorade  2 extra strength Tylenol (1,000mg  total)  1 cup coffee or Coke  If this doesn't help please call @ [phone]     Go to [email or web address] to register for FREE online childbirth classes   Call the office [phone]) or go to [facility] if:  You begin to have strong, frequent contractions  Your water breaks.  Sometimes it is a big gush of fluid, sometimes it is just a trickle that keeps getting your panties wet or running down your legs  You have vaginal bleeding.  It is normal to have a small amount of spotting if your cervix was checked.   You don't feel your baby moving like normal.  If you don't, get you something to eat and drink and lay down and focus on feeling your baby move.   If your baby is still not moving like normal, you should call the office or go to [facility].  [place] Pediatricians/Family Doctors:  [phone] Pediatrics [phone]            [facility] [phone]                 [facility] [phone] (usually not accepting new patients unless you have family there already, you are always welcome to call and ask)       [facility] Department [phone]       [facility]  Pediatricians/Family Doctors:   [facility]: [phone]  [facility]: [phone]  [facility]: [phone]  [facility] Doctors:   [facility]: [phone]   [facility]: [phone]  [facility] Doctors:  [facility]: [phone]   Home Blood Pressure Monitoring for Patients   Your provider has recommended that you check your blood pressure (BP) at least once a week at home. If you do not have a blood pressure cuff at home, one will be provided for you. Contact your provider if you have not received your monitor within 1 week.    Helpful Tips for Accurate Home Blood Pressure Checks  . Don't smoke, exercise, or drink caffeine 30 minutes before checking your BP . Use the restroom before checking your BP (a full bladder can raise your pressure) . Relax in a comfortable upright chair . Feet on the ground . Left arm resting comfortably on a flat surface at the level of your heart . Legs uncrossed . Back supported . Sit quietly and don't talk . Place the cuff on your bare arm . Adjust snuggly, so that only two fingertips can fit between your skin and the top of the cuff . Check 2 readings separated by at least one minute . Keep a log of your BP readings . For a visual, please reference this diagram: http://ccnc.care/bpdiagram  Provider Name: [facility]     Phone: [phone]  Zone 1: ALL CLEAR  Continue to monitor your symptoms:  . BP reading is less than 140 (top number) or less than 90 (bottom number)  . No right upper stomach pain . No headaches or seeing spots . No feeling nauseated or throwing up . No swelling in face and hands  Zone 2: CAUTION Call your doctor's office for any of the following:  . BP reading is greater than 140 (top number) or greater than 90 (bottom number)  . Stomach pain under your ribs in the middle or right side . Headaches or seeing spots . Feeling nauseated or throwing up . Swelling in face and hands  Zone 3: EMERGENCY  Seek immediate medical care if you have any of the following:  . BP reading is greater than160 (top number) or greater than 110 (bottom number) . Severe headaches not improving with Tylenol . Serious difficulty catching your breath . Any worsening symptoms from Zone 2   Second Trimester of Pregnancy The second trimester is from week 13 through week 28, months 4 through 6. The second trimester is often a time when you feel your best. Your body has also adjusted to being pregnant, and you begin to feel better physically. Usually, morning sickness has  lessened or quit completely, you may have more energy, and you may have an increase in appetite. The second trimester is also a time when the fetus is growing rapidly. At the end of the sixth month, the fetus is about 9 inches long and weighs about 1 pounds. You will likely begin to feel the baby move (quickening) between 18 and 20 weeks of the pregnancy. BODY CHANGES Your body goes through many changes during pregnancy. The changes vary from woman to woman.   Your weight will continue to increase. You will notice your lower abdomen bulging out.  You may begin to get stretch marks on your hips, abdomen, and breasts.  You may develop headaches that can be relieved by medicines approved by your health care provider.  You may urinate more  often because the fetus is pressing on your bladder.  You may develop or continue to have heartburn as a result of your pregnancy.  You may develop constipation because certain hormones are causing the muscles that push waste through your intestines to slow down.  You may develop hemorrhoids or swollen, bulging veins (varicose veins).  You may have back pain because of the weight gain and pregnancy hormones relaxing your joints between the bones in your pelvis and as a result of a shift in weight and the muscles that support your balance.  Your breasts will continue to grow and be tender.  Your gums may bleed and may be sensitive to brushing and flossing.  Dark spots or blotches (chloasma, mask of pregnancy) may develop on your face. This will likely fade after the baby is born.  A dark line from your belly button to the pubic area (linea nigra) may appear. This will likely fade after the baby is born.  You may have changes in your hair. These can include thickening of your hair, rapid growth, and changes in texture. Some women also have hair loss during or after pregnancy, or hair that feels dry or thin. Your hair will most likely return to normal after  your baby is born. WHAT TO EXPECT AT YOUR PRENATAL VISITS During a routine prenatal visit:  You will be weighed to make sure you and the fetus are growing normally.  Your blood pressure will be taken.  Your abdomen will be measured to track your baby's growth.  The fetal heartbeat will be listened to.  Any test results from the previous visit will be discussed. Your health care provider may ask you:  How you are feeling.  If you are feeling the baby move.  If you have had any abnormal symptoms, such as leaking fluid, bleeding, severe headaches, or abdominal cramping.  If you have any questions. Other tests that may be performed during your second trimester include:  Blood tests that check for:  Low iron levels (anemia).  Gestational diabetes (between 24 and 28 weeks).  Rh antibodies.  Urine tests to check for infections, diabetes, or protein in the urine.  An ultrasound to confirm the proper growth and development of the baby.  An amniocentesis to check for possible genetic problems.  Fetal screens for spina bifida and Down syndrome. HOME CARE INSTRUCTIONS   Avoid all smoking, herbs, alcohol, and unprescribed drugs. These chemicals affect the formation and growth of the baby.  Follow your health care provider's instructions regarding medicine use. There are medicines that are either safe or unsafe to take during pregnancy.  Exercise only as directed by your health care provider. Experiencing uterine cramps is a good sign to stop exercising.  Continue to eat regular, healthy meals.  Wear a good support bra for breast tenderness.  Do not use hot tubs, steam rooms, or saunas.  Wear your seat belt at all times when driving.  Avoid raw meat, uncooked cheese, cat litter boxes, and soil used by cats. These carry germs that can cause birth defects in the baby.  Take your prenatal vitamins.  Try taking a stool softener (if your health care provider approves) if you  develop constipation. Eat more high-fiber foods, such as fresh vegetables or fruit and whole grains. Drink plenty of fluids to keep your urine clear or pale yellow.  Take warm sitz baths to soothe any pain or discomfort caused by hemorrhoids. Use hemorrhoid cream if your health care provider approves.  If you develop varicose veins, wear support hose. Elevate your feet for 15 minutes, 3-4 times a day. Limit salt in your diet.  Avoid heavy lifting, wear low heel shoes, and practice good posture.  Rest with your legs elevated if you have leg cramps or low back pain.  Visit your dentist if you have not gone yet during your pregnancy. Use a soft toothbrush to brush your teeth and be gentle when you floss.  A sexual relationship may be continued unless your health care provider directs you otherwise.  Continue to go to all your prenatal visits as directed by your health care provider. SEEK MEDICAL CARE IF:   You have dizziness.  You have mild pelvic cramps, pelvic pressure, or nagging pain in the abdominal area.  You have persistent nausea, vomiting, or diarrhea.  You have a bad smelling vaginal discharge.  You have pain with urination. SEEK IMMEDIATE MEDICAL CARE IF:   You have a fever.  You are leaking fluid from your vagina.  You have spotting or bleeding from your vagina.  You have severe abdominal cramping or pain.  You have rapid weight gain or loss.  You have shortness of breath with chest pain.  You notice sudden or extreme swelling of your face, hands, ankles, feet, or legs.  You have not felt your baby move in over an hour.  You have severe headaches that do not go away with medicine.  You have vision changes. Document Released: 04/27/2001 Document Revised: 05/08/2013 Document Reviewed: 07/04/2012 [facility] Patient Information 2015 [place], [place]. This information is not intended to replace advice given to you by your health care provider. Make sure you discuss  any questions you have with your health care provider.

## 2020-03-23 LAB — GC/CHLAMYDIA PROBE AMP
Chlamydia trachomatis, NAA: NEGATIVE
Neisseria Gonorrhoeae by PCR: NEGATIVE

## 2020-03-24 ENCOUNTER — Other Ambulatory Visit: Payer: Medicaid Other

## 2020-03-24 DIAGNOSIS — Z331 Pregnant state, incidental: Secondary | ICD-10-CM

## 2020-03-24 DIAGNOSIS — Z3A27 27 weeks gestation of pregnancy: Secondary | ICD-10-CM

## 2020-03-24 DIAGNOSIS — Z3480 Encounter for supervision of other normal pregnancy, unspecified trimester: Secondary | ICD-10-CM

## 2020-03-25 LAB — PMP SCREEN PROFILE (10S), URINE
Amphetamine Scrn, Ur: NEGATIVE ng/mL
BARBITURATE SCREEN URINE: NEGATIVE ng/mL
BENZODIAZEPINE SCREEN, URINE: NEGATIVE ng/mL
CANNABINOIDS UR QL SCN: NEGATIVE ng/mL
Cocaine (Metab) Scrn, Ur: NEGATIVE ng/mL
Creatinine(Crt), U: 41.6 mg/dL (ref 20.0–300.0)
Methadone Screen, Urine: NEGATIVE ng/mL
OXYCODONE+OXYMORPHONE UR QL SCN: POSITIVE ng/mL — AB
Opiate Scrn, Ur: NEGATIVE ng/mL
Ph of Urine: 8.5 (ref 4.5–8.9)
Phencyclidine Qn, Ur: NEGATIVE ng/mL
Propoxyphene Scrn, Ur: NEGATIVE ng/mL

## 2020-03-26 ENCOUNTER — Other Ambulatory Visit: Payer: Medicaid Other

## 2020-03-26 LAB — URINE CULTURE

## 2020-04-03 ENCOUNTER — Other Ambulatory Visit: Payer: Self-pay

## 2020-04-03 ENCOUNTER — Other Ambulatory Visit: Payer: Medicaid Other

## 2020-04-03 DIAGNOSIS — Z3483 Encounter for supervision of other normal pregnancy, third trimester: Secondary | ICD-10-CM

## 2020-04-03 DIAGNOSIS — Z3A29 29 weeks gestation of pregnancy: Secondary | ICD-10-CM

## 2020-04-04 LAB — CBC
Hematocrit: 30.4 % — ABNORMAL LOW (ref 34.0–46.6)
Hemoglobin: 9.9 g/dL — ABNORMAL LOW (ref 11.1–15.9)
MCH: 27.2 pg (ref 26.6–33.0)
MCHC: 32.6 g/dL (ref 31.5–35.7)
MCV: 84 fL (ref 79–97)
Platelets: 259 10*3/uL (ref 150–450)
RBC: 3.64 x10E6/uL — ABNORMAL LOW (ref 3.77–5.28)
RDW: 12.8 % (ref 11.7–15.4)
WBC: 11.8 10*3/uL — ABNORMAL HIGH (ref 3.4–10.8)

## 2020-04-04 LAB — GLUCOSE TOLERANCE, 2 HOURS W/ 1HR
Glucose, 1 hour: 166 mg/dL (ref 65–179)
Glucose, 2 hour: 59 mg/dL — ABNORMAL LOW (ref 65–152)
Glucose, Fasting: 85 mg/dL (ref 65–91)

## 2020-04-04 LAB — ANTIBODY SCREEN: Antibody Screen: NEGATIVE

## 2020-04-04 LAB — RPR: RPR Ser Ql: NONREACTIVE

## 2020-04-04 LAB — HIV ANTIBODY (ROUTINE TESTING W REFLEX): HIV Screen 4th Generation wRfx: NONREACTIVE

## 2020-04-07 ENCOUNTER — Telehealth: Payer: Self-pay | Admitting: *Deleted

## 2020-04-07 ENCOUNTER — Other Ambulatory Visit: Payer: Self-pay | Admitting: Women's Health

## 2020-04-07 MED ORDER — FERROUS SULFATE 325 (65 FE) MG PO TABS: 325.0000 mg | ORAL_TABLET | ORAL | 0 refills | Status: AC

## 2020-04-07 NOTE — Progress Notes (Signed)
f °

## 2020-04-07 NOTE — Telephone Encounter (Signed)
-----   Message from [name], [place] sent at 04/07/2020  2:36 PM EST ----- Please let her know that she is anemic, I have sent in rx for fe to her pharmacy, make sure she's taking pnv daily, increase fe-rich foods: red meats, green leafy vegs, beans, etc. Sugar test is normal. Thanks!

## 2020-04-08 NOTE — Telephone Encounter (Signed)
Called and left message that I am calling with results.  

## 2020-04-14 ENCOUNTER — Other Ambulatory Visit: Payer: Self-pay | Admitting: Women's Health

## 2020-04-14 ENCOUNTER — Other Ambulatory Visit: Payer: Self-pay | Admitting: Obstetrics & Gynecology

## 2020-04-14 DIAGNOSIS — F1120 Opioid dependence, uncomplicated: Secondary | ICD-10-CM

## 2020-04-14 DIAGNOSIS — O4402 Placenta previa specified as without hemorrhage, second trimester: Secondary | ICD-10-CM

## 2020-04-14 DIAGNOSIS — O09523 Supervision of elderly multigravida, third trimester: Secondary | ICD-10-CM

## 2020-04-14 DIAGNOSIS — O3680X0 Pregnancy with inconclusive fetal viability, not applicable or unspecified: Secondary | ICD-10-CM

## 2020-04-14 DIAGNOSIS — O99320 Drug use complicating pregnancy, unspecified trimester: Secondary | ICD-10-CM

## 2020-04-14 DIAGNOSIS — O09522 Supervision of elderly multigravida, second trimester: Secondary | ICD-10-CM

## 2020-04-15 ENCOUNTER — Ambulatory Visit (INDEPENDENT_AMBULATORY_CARE_PROVIDER_SITE_OTHER): Payer: Medicaid Other | Admitting: Obstetrics & Gynecology

## 2020-04-15 ENCOUNTER — Other Ambulatory Visit: Payer: Self-pay

## 2020-04-15 ENCOUNTER — Ambulatory Visit: Payer: Medicaid Other

## 2020-04-15 ENCOUNTER — Ambulatory Visit (INDEPENDENT_AMBULATORY_CARE_PROVIDER_SITE_OTHER): Payer: Medicaid Other

## 2020-04-15 ENCOUNTER — Encounter: Payer: Self-pay | Admitting: Obstetrics & Gynecology

## 2020-04-15 VITALS — BP 116/73 | HR 84 | Wt 190.0 lb

## 2020-04-15 DIAGNOSIS — F1120 Opioid dependence, uncomplicated: Secondary | ICD-10-CM | POA: Diagnosis not present

## 2020-04-15 DIAGNOSIS — O99320 Drug use complicating pregnancy, unspecified trimester: Secondary | ICD-10-CM | POA: Diagnosis not present

## 2020-04-15 DIAGNOSIS — O4402 Placenta previa specified as without hemorrhage, second trimester: Secondary | ICD-10-CM | POA: Diagnosis not present

## 2020-04-15 DIAGNOSIS — O09522 Supervision of elderly multigravida, second trimester: Secondary | ICD-10-CM | POA: Diagnosis not present

## 2020-04-15 DIAGNOSIS — Z1389 Encounter for screening for other disorder: Secondary | ICD-10-CM

## 2020-04-15 DIAGNOSIS — O0993 Supervision of high risk pregnancy, unspecified, third trimester: Secondary | ICD-10-CM

## 2020-04-15 DIAGNOSIS — Z3A30 30 weeks gestation of pregnancy: Secondary | ICD-10-CM

## 2020-04-15 DIAGNOSIS — Z6791 Unspecified blood type, Rh negative: Secondary | ICD-10-CM

## 2020-04-15 DIAGNOSIS — Z331 Pregnant state, incidental: Secondary | ICD-10-CM

## 2020-04-15 DIAGNOSIS — O26899 Other specified pregnancy related conditions, unspecified trimester: Secondary | ICD-10-CM

## 2020-04-15 DIAGNOSIS — O09523 Supervision of elderly multigravida, third trimester: Secondary | ICD-10-CM

## 2020-04-15 LAB — POCT URINALYSIS DIPSTICK OB
Blood, UA: NEGATIVE
Glucose, UA: NEGATIVE
Ketones, UA: NEGATIVE
Leukocytes, UA: NEGATIVE
Nitrite, UA: NEGATIVE
POC,PROTEIN,UA: NEGATIVE

## 2020-04-15 NOTE — Progress Notes (Signed)
HIGH-RISK PREGNANCY VISIT Patient name: Denise Conner MRN [id number]  Date of birth: 1975-12-04 Chief Complaint:   Routine Prenatal Visit  History of Present Illness:   Denise Conner is a 44 y.o. J8A4166 female at [redacted]w[redacted]d with an Estimated Date of Delivery: 06/18/20 being seen today for ongoing management of a high-risk pregnancy complicated by previa, FGR, AMA, smoker, narcotic abuse.  Today she reports no complaints.  Depression screen PHQ 2/9 03/21/2020  Decreased Interest 1  Down, Depressed, Hopeless 0  PHQ - 2 Score 1  Altered sleeping 2  Tired, decreased energy 3  Change in appetite 0  Feeling bad or failure about yourself  0  Trouble concentrating 1  Moving slowly or fidgety/restless 0  Suicidal thoughts 0  PHQ-9 Score 7    Contractions: Not present. Vag. Bleeding: None.  Movement: Present. denies leaking of fluid.  Review of Systems:   Pertinent items are noted in HPI Denies abnormal vaginal discharge w/ itching/odor/irritation, headaches, visual changes, shortness of breath, chest pain, abdominal pain, severe nausea/vomiting, or problems with urination or bowel movements unless otherwise stated above. Pertinent History Reviewed:  Reviewed past medical,surgical, social, obstetrical and family history.  Reviewed problem list, medications and allergies. Physical Assessment:   Vitals:   04/15/20 1010  BP: 116/73  Pulse: 84  Weight: 190 lb (86.2 kg)  Body mass index is 29.32 kg/m.           Physical Examination:   General appearance: alert, well appearing, and in no distress  Mental status: alert, oriented to person, place, and time  Skin: warm & dry   Extremities: Edema: Trace    Cardiovascular: normal heart rate noted  Respiratory: normal respiratory effort, no distress  Abdomen: gravid, soft, non-tender  Pelvic: Cervical exam deferred         Fetal Status:     Movement: Present    Fetal Surveillance Testing today: sonogrm   Chaperone: N/A    Results for orders  placed or performed in visit on 04/15/20 (from the past 24 hour(s))  POC Urinalysis Dipstick OB   Collection Time: 04/15/20 10:10 AM  Result Value Ref Range   Color, UA     Clarity, UA     Glucose, UA Negative Negative   Bilirubin, UA     Ketones, UA neg    Spec Grav, UA     Blood, UA neg    pH, UA     POC,PROTEIN,UA Negative Negative, Trace, Small (1+), Moderate (2+), Large (3+), 4+   Urobilinogen, UA     Nitrite, UA neg    Leukocytes, UA Negative Negative   Appearance     Odor      Assessment & Plan:  High-risk pregnancy: A6T0160 at [redacted]w[redacted]d with an Estimated Date of Delivery: 06/18/20   1) Complete placenta previa, posterior, , same, doubt will resolve, not central, deliver 36-37 weeks or as clinically indicated  2) FGR, borderline 9.3% but with previa will recommend full surveillance beginning at 32 weeks  ,   AMA, smoking, narcotic abuse  Meds: No orders of the defined types were placed in this encounter.   Labs/procedures today: sonogram  Treatment Plan:  Begin twice weekly surveillance next week  Reviewed: Preterm labor symptoms and general obstetric precautions including but not limited to vaginal bleeding, contractions, leaking of fluid and fetal movement were reviewed in detail with the patient.  All questions were answered. Has home bp cuff. Rx faxed to . Check bp weekly, let us know if >  140/90.   Follow-up: Return in about 9 days (around 04/24/2020) for BPP/sono, HROB.   No future appointments.  Orders Placed This Encounter  Procedures  . Urine Culture  . RHO (D) Immune Globulin  . POC Urinalysis Dipstick OB   Lazaro Arms  04/15/2020 12:37 PM

## 2020-04-15 NOTE — Progress Notes (Signed)
[place] 30+6 wks,transverse head right,complete posterior placenta previa gr 1,cx 3.3 cm,AFI 15 cm,fhr 116 bpm,EFW 1421 g 9.3 % FL .8%

## 2020-04-17 LAB — URINE CULTURE

## 2020-04-23 ENCOUNTER — Other Ambulatory Visit: Payer: Self-pay | Admitting: Obstetrics & Gynecology

## 2020-04-23 DIAGNOSIS — O4403 Placenta previa specified as without hemorrhage, third trimester: Secondary | ICD-10-CM

## 2020-04-23 DIAGNOSIS — O09523 Supervision of elderly multigravida, third trimester: Secondary | ICD-10-CM

## 2020-04-24 ENCOUNTER — Ambulatory Visit (INDEPENDENT_AMBULATORY_CARE_PROVIDER_SITE_OTHER): Payer: Medicaid Other

## 2020-04-24 ENCOUNTER — Other Ambulatory Visit: Payer: Self-pay

## 2020-04-24 ENCOUNTER — Encounter: Payer: Self-pay | Admitting: Obstetrics & Gynecology

## 2020-04-24 ENCOUNTER — Ambulatory Visit (INDEPENDENT_AMBULATORY_CARE_PROVIDER_SITE_OTHER): Payer: Medicaid Other | Admitting: Obstetrics & Gynecology

## 2020-04-24 VITALS — BP 126/81 | HR 71 | Wt 188.0 lb

## 2020-04-24 DIAGNOSIS — O2343 Unspecified infection of urinary tract in pregnancy, third trimester: Secondary | ICD-10-CM

## 2020-04-24 DIAGNOSIS — Z1389 Encounter for screening for other disorder: Secondary | ICD-10-CM

## 2020-04-24 DIAGNOSIS — O4403 Placenta previa specified as without hemorrhage, third trimester: Secondary | ICD-10-CM | POA: Diagnosis not present

## 2020-04-24 DIAGNOSIS — Z331 Pregnant state, incidental: Secondary | ICD-10-CM

## 2020-04-24 DIAGNOSIS — Z6791 Unspecified blood type, Rh negative: Secondary | ICD-10-CM

## 2020-04-24 DIAGNOSIS — O0993 Supervision of high risk pregnancy, unspecified, third trimester: Secondary | ICD-10-CM

## 2020-04-24 DIAGNOSIS — Z3A32 32 weeks gestation of pregnancy: Secondary | ICD-10-CM

## 2020-04-24 DIAGNOSIS — O26899 Other specified pregnancy related conditions, unspecified trimester: Secondary | ICD-10-CM

## 2020-04-24 DIAGNOSIS — O09523 Supervision of elderly multigravida, third trimester: Secondary | ICD-10-CM | POA: Diagnosis not present

## 2020-04-24 LAB — POCT URINALYSIS DIPSTICK OB
Blood, UA: NEGATIVE
Glucose, UA: NEGATIVE
Ketones, UA: NEGATIVE
Leukocytes, UA: NEGATIVE
Nitrite, UA: POSITIVE
POC,PROTEIN,UA: NEGATIVE

## 2020-04-24 MED ORDER — NITROFURANTOIN MONOHYD MACRO 100 MG PO CAPS: 100.0000 mg | ORAL_CAPSULE | Freq: Two times a day (BID) | ORAL | 0 refills | Status: DC

## 2020-04-24 NOTE — Progress Notes (Addendum)
[place] 32+1 wks,transverse head left,posterior complete placenta previa gr 1,cx 2.6 cm,afi 13.8 cm,fhr 133 bpm,BPP 8/8

## 2020-04-24 NOTE — Progress Notes (Signed)
HIGH-RISK PREGNANCY VISIT Patient name: Denise Conner MRN [id number]  Date of birth: 1975-11-28 Chief Complaint:   High Risk Gestation ([place] today; urine dark in color; low back pain)  History of Present Illness:   Denise Conner is a 44 y.o. [phone] female at [redacted]w[redacted]d with an Estimated Date of Delivery: 06/18/20 being seen today for ongoing management of a high-risk pregnancy complicated by advanced maternal age, fetal growth restriction 9% and complete previa, posterior.  Today she reports no complaints.  Depression screen PHQ 2/9 03/21/2020  Decreased Interest 1  Down, Depressed, Hopeless 0  PHQ - 2 Score 1  Altered sleeping 2  Tired, decreased energy 3  Change in appetite 0  Feeling bad or failure about yourself  0  Trouble concentrating 1  Moving slowly or fidgety/restless 0  Suicidal thoughts 0  PHQ-9 Score 7    Contractions: Not present. Vag. Bleeding: None.  Movement: Present. denies leaking of fluid.  Review of Systems:   Pertinent items are noted in HPI Denies abnormal vaginal discharge w/ itching/odor/irritation, headaches, visual changes, shortness of breath, chest pain, abdominal pain, severe nausea/vomiting, or problems with urination or bowel movements unless otherwise stated above. Pertinent History Reviewed:  Reviewed past medical,surgical, social, obstetrical and family history.  Reviewed problem list, medications and allergies. Physical Assessment:   Vitals:   04/24/20 0928  BP: 126/81  Pulse: 71  Weight: 188 lb (85.3 kg)  Body mass index is 29.01 kg/m.           Physical Examination:   General appearance: alert, well appearing, and in no distress  Mental status: alert, oriented to person, place, and time  Skin: warm & dry   Extremities: Edema: Trace    Cardiovascular: normal heart rate noted  Respiratory: normal respiratory effort, no distress  Abdomen: gravid, soft, non-tender  Pelvic: Cervical exam deferred         Fetal Status:     Movement: Present     Fetal Surveillance Testing today: BPP  8/8  Chaperone: N/A    Results for orders placed or performed in visit on 04/24/20 (from the past 24 hour(s))  POC Urinalysis Dipstick OB   Collection Time: 04/24/20  9:23 AM  Result Value Ref Range   Color, UA     Clarity, UA     Glucose, UA Negative Negative   Bilirubin, UA     Ketones, UA neg    Spec Grav, UA     Blood, UA neg    pH, UA     POC,PROTEIN,UA Negative Negative, Trace, Small (1+), Moderate (2+), Large (3+), 4+   Urobilinogen, UA     Nitrite, UA positive    Leukocytes, UA Negative Negative   Appearance     Odor      Assessment & Plan:  High-risk pregnancy: [id number] at [redacted]w[redacted]d with an Estimated Date of Delivery: 06/18/20   1) Complete previa, posterior, repeat C section + BTL scheduled 05/28/20 @ 1330 LHE, stable, no bleeding  2) AMA, suboxone FGR 9%, testing all good, contineu twice weekly  3)recurrent UTI, treat with macrobid today, culture pending  4) previous c section, previa is posterior  Meds:  Meds ordered this encounter  Medications  . nitrofurantoin, macrocrystal-monohydrate, (MACROBID) 100 MG capsule    Sig: Take 1 capsule (100 mg total) by mouth 2 (two) times daily.    Dispense:  14 capsule    Refill:  0    Labs/procedures today: BPP 8/8  Treatment Plan:  Twice weekly  surveillance c section at 37 weeks 05/28/20  Reviewed: Preterm labor symptoms and general obstetric precautions including but not limited to vaginal bleeding, contractions, leaking of fluid and fetal movement were reviewed in detail with the patient.  All questions were answered. Has home bp cuff. Rx faxed to . Check bp weekly, let us know if >140/90.   Follow-up: No follow-ups on file.   No future appointments.  Orders Placed This Encounter  Procedures  . Urine Culture  . POC Urinalysis Dipstick OB   Jalyne Brodzinski H Alyanah Elliott  04/24/2020 10:00 AM

## 2020-04-27 LAB — URINE CULTURE

## 2020-04-30 ENCOUNTER — Other Ambulatory Visit: Payer: Self-pay

## 2020-04-30 ENCOUNTER — Ambulatory Visit (INDEPENDENT_AMBULATORY_CARE_PROVIDER_SITE_OTHER): Payer: Medicaid Other | Admitting: *Deleted

## 2020-04-30 VITALS — BP 106/78 | HR 68

## 2020-04-30 DIAGNOSIS — O0993 Supervision of high risk pregnancy, unspecified, third trimester: Secondary | ICD-10-CM | POA: Diagnosis not present

## 2020-04-30 DIAGNOSIS — O09523 Supervision of elderly multigravida, third trimester: Secondary | ICD-10-CM

## 2020-04-30 DIAGNOSIS — Z3A33 33 weeks gestation of pregnancy: Secondary | ICD-10-CM

## 2020-05-01 NOTE — Progress Notes (Signed)
   NURSE VISIT- NST  SUBJECTIVE:  Denise Conner is a 44 y.o. [phone] female at [redacted]w[redacted]d, here for a NST for pregnancy complicated by AMA.  She reports active fetal movement, contractions: none, vaginal bleeding: none, membranes: intact.   OBJECTIVE:  LMP  (LMP Unknown)   Appears well, no apparent distress  No results found for this or any previous visit (from the past 24 hour(s)).  NST: FHR baseline 120 bpm, Variability: moderate, Accelerations:present, Decelerations:  Absent= Cat 1/Reactive Toco: none   ASSESSMENT: [id number] at [redacted]w[redacted]d with AMA NST reactive  PLAN: EFM strip reviewed by Philipp Deputy, CNM   Recommendations: keep next appointment as scheduled    Annamarie Dawley  05/01/2020 3:47 PM

## 2020-05-02 ENCOUNTER — Ambulatory Visit (INDEPENDENT_AMBULATORY_CARE_PROVIDER_SITE_OTHER): Payer: Medicaid Other | Admitting: *Deleted

## 2020-05-02 ENCOUNTER — Other Ambulatory Visit: Payer: Self-pay

## 2020-05-02 VITALS — BP 138/82 | HR 79 | Wt 187.8 lb

## 2020-05-02 DIAGNOSIS — Z3A33 33 weeks gestation of pregnancy: Secondary | ICD-10-CM | POA: Diagnosis not present

## 2020-05-02 DIAGNOSIS — Z1389 Encounter for screening for other disorder: Secondary | ICD-10-CM

## 2020-05-02 DIAGNOSIS — O0993 Supervision of high risk pregnancy, unspecified, third trimester: Secondary | ICD-10-CM | POA: Diagnosis not present

## 2020-05-02 DIAGNOSIS — O09523 Supervision of elderly multigravida, third trimester: Secondary | ICD-10-CM | POA: Diagnosis not present

## 2020-05-02 DIAGNOSIS — O09522 Supervision of elderly multigravida, second trimester: Secondary | ICD-10-CM

## 2020-05-02 DIAGNOSIS — Z331 Pregnant state, incidental: Secondary | ICD-10-CM

## 2020-05-02 LAB — POCT URINALYSIS DIPSTICK OB
Blood, UA: NEGATIVE
Glucose, UA: NEGATIVE
Ketones, UA: NEGATIVE
Leukocytes, UA: NEGATIVE
Nitrite, UA: NEGATIVE
POC,PROTEIN,UA: NEGATIVE

## 2020-05-02 NOTE — Progress Notes (Addendum)
   NURSE VISIT- NST  SUBJECTIVE:  Denise Conner is a 44 y.o. [phone] female at [redacted]w[redacted]d, here for a NST for pregnancy complicated by AMA and FGR.  She reports active fetal movement, contractions: none, vaginal bleeding: none, membranes: intact.   OBJECTIVE:  BP 138/82   Pulse 79   Wt 187 lb 12.8 oz (85.2 kg)   LMP  (LMP Unknown)   BMI 28.98 kg/m   Appears well, no apparent distress  Results for orders placed or performed in visit on 05/02/20 (from the past 24 hour(s))  POC Urinalysis Dipstick OB   Collection Time: 05/02/20 11:29 AM  Result Value Ref Range   Color, UA     Clarity, UA     Glucose, UA Negative Negative   Bilirubin, UA     Ketones, UA neg    Spec Grav, UA     Blood, UA neg    pH, UA     POC,PROTEIN,UA Negative Negative, Trace, Small (1+), Moderate (2+), Large (3+), 4+   Urobilinogen, UA     Nitrite, UA neg    Leukocytes, UA Negative Negative   Appearance     Odor      NST: FHR baseline 95 bpm, Variability: moderate, Accelerations:present, Decelerations:  Absent= Cat 1/Reactive Toco: none   ASSESSMENT: [id number] at [redacted]w[redacted]d with AMA and FGR NST reactive  PLAN: EFM strip reviewed by Dr. Despina Hidden   Recommendations: keep next appointment as scheduled    [name]  05/02/2020 12:16 PM  Attestation of Attending Supervision of Nursing Visit Encounter: Evaluation and management procedures were performed by the nursing staff under my supervision and collaboration.  I have reviewed the nurse's note and chart, and I agree with the management and plan.  Rockne Coons MD Attending Physician for the Center for [facility] 05/18/2020 7:53 AM

## 2020-05-06 ENCOUNTER — Encounter: Payer: Self-pay | Admitting: Women's Health

## 2020-05-06 ENCOUNTER — Other Ambulatory Visit: Payer: Self-pay

## 2020-05-06 ENCOUNTER — Ambulatory Visit (INDEPENDENT_AMBULATORY_CARE_PROVIDER_SITE_OTHER): Payer: Medicaid Other | Admitting: Women's Health

## 2020-05-06 VITALS — BP 120/76 | HR 69 | Wt 187.0 lb

## 2020-05-06 DIAGNOSIS — O09523 Supervision of elderly multigravida, third trimester: Secondary | ICD-10-CM

## 2020-05-06 DIAGNOSIS — Z3A33 33 weeks gestation of pregnancy: Secondary | ICD-10-CM

## 2020-05-06 DIAGNOSIS — O365930 Maternal care for other known or suspected poor fetal growth, third trimester, not applicable or unspecified: Secondary | ICD-10-CM

## 2020-05-06 DIAGNOSIS — O0993 Supervision of high risk pregnancy, unspecified, third trimester: Secondary | ICD-10-CM

## 2020-05-06 DIAGNOSIS — Z331 Pregnant state, incidental: Secondary | ICD-10-CM

## 2020-05-06 DIAGNOSIS — F1120 Opioid dependence, uncomplicated: Secondary | ICD-10-CM

## 2020-05-06 DIAGNOSIS — O4403 Placenta previa specified as without hemorrhage, third trimester: Secondary | ICD-10-CM

## 2020-05-06 DIAGNOSIS — Z1389 Encounter for screening for other disorder: Secondary | ICD-10-CM

## 2020-05-06 DIAGNOSIS — Z6791 Unspecified blood type, Rh negative: Secondary | ICD-10-CM

## 2020-05-06 DIAGNOSIS — O99320 Drug use complicating pregnancy, unspecified trimester: Secondary | ICD-10-CM

## 2020-05-06 DIAGNOSIS — O26899 Other specified pregnancy related conditions, unspecified trimester: Secondary | ICD-10-CM

## 2020-05-06 DIAGNOSIS — O2342 Unspecified infection of urinary tract in pregnancy, second trimester: Secondary | ICD-10-CM

## 2020-05-06 LAB — POCT URINALYSIS DIPSTICK OB
Blood, UA: NEGATIVE
Glucose, UA: NEGATIVE
Ketones, UA: NEGATIVE
Leukocytes, UA: NEGATIVE
Nitrite, UA: NEGATIVE
POC,PROTEIN,UA: NEGATIVE

## 2020-05-06 MED ORDER — ONDANSETRON HCL 4 MG PO TABS: 4.0000 mg | ORAL_TABLET | Freq: Three times a day (TID) | ORAL | 0 refills | Status: AC | PRN

## 2020-05-06 NOTE — Progress Notes (Signed)
HIGH-RISK PREGNANCY VISIT Patient name: Denise Conner MRN [id number]  Date of birth: 09/19/1975 Chief Complaint:   High Risk Gestation (NST)  History of Present Illness:   Denise Conner is a 44 y.o. [phone] female at [redacted]w[redacted]d with an Estimated Date of Delivery: 06/18/20 being seen today for ongoing management of a high-risk pregnancy complicated by advanced maternal age, fetal growth restriction 9.3%, complete previa, suboxone & oxycodone use off street.  Suboxone 1-1.5 strips/day, oxycodone 10mg  2-3/day only if doesn't have strips. Had appt w/ Dr. , but office moved. Plans to go there today to initiate care. Nausea, requests meds.  Depression screen PHQ 2/9 03/21/2020  Decreased Interest 1  Down, Depressed, Hopeless 0  PHQ - 2 Score 1  Altered sleeping 2  Tired, decreased energy 3  Change in appetite 0  Feeling bad or failure about yourself  0  Trouble concentrating 1  Moving slowly or fidgety/restless 0  Suicidal thoughts 0  PHQ-9 Score 7    Contractions: Not present. Vag. Bleeding: None.  Movement: Present. denies leaking of fluid.  Review of Systems:   Pertinent items are noted in HPI Denies abnormal vaginal discharge w/ itching/odor/irritation, headaches, visual changes, shortness of breath, chest pain, abdominal pain, severe nausea/vomiting, or problems with urination or bowel movements unless otherwise stated above. Pertinent History Reviewed:  Reviewed past medical,surgical, social, obstetrical and family history.  Reviewed problem list, medications and allergies. Physical Assessment:   Vitals:   05/06/20 1119  BP: 120/76  Pulse: 69  Weight: 187 lb (84.8 kg)  Body mass index is 28.86 kg/m.           Physical Examination:   General appearance: alert, well appearing, and in no distress  Mental status: alert, oriented to person, place, and time  Skin: warm & dry   Extremities: Edema: None    Cardiovascular: normal heart rate noted  Respiratory: normal respiratory  effort, no distress  Abdomen: gravid, soft, non-tender  Pelvic: Cervical exam deferred         Fetal Status: Fetal Heart Rate (bpm): 110 Fundal Height: 25 cm Movement: Present    Fetal Surveillance Testing today: NST: FHR baseline 105-110 bpm, Variability: moderate, Accelerations:present, Decelerations:  Absent= Cat 1/Reactive Toco: none     Chaperone: N/A    Results for orders placed or performed in visit on 05/06/20 (from the past 24 hour(s))  POC Urinalysis Dipstick OB   Collection Time: 05/06/20 11:19 AM  Result Value Ref Range   Color, UA     Clarity, UA     Glucose, UA Negative Negative   Bilirubin, UA     Ketones, UA neg    Spec Grav, UA     Blood, UA neg    pH, UA     POC,PROTEIN,UA Negative Negative, Trace, Small (1+), Moderate (2+), Large (3+), 4+   Urobilinogen, UA     Nitrite, UA neg    Leukocytes, UA Negative Negative   Appearance     Odor      Assessment & Plan:  High-risk pregnancy: 05/08/20 at [redacted]w[redacted]d with an Estimated Date of Delivery: 06/18/20   1) AMA 44yo, stable  2) FGR 9.3%, @30wks , will get EFW next week  3) Complete posterior previa> pelvic rest, RCS  4) 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 08/16/20 today to initiate care  5) Prev c/s> for RCS w/ BTL  6) Nausea> rx zofran  Meds:  Meds ordered this encounter  Medications  . ondansetron (ZOFRAN) 4  MG tablet    Sig: Take 1 tablet (4 mg total) by mouth every 8 (eight) hours as needed for nausea or vomiting.    Dispense:  20 tablet    Refill:  0    Order Specific Question:   Supervising Provider    Answer:   Lazaro Arms [[id number]]    Labs/procedures today: nst  Treatment Plan:  2x/wk testing, efw q4wks, RCS @ 39wks  Reviewed: Preterm labor symptoms and general obstetric precautions including but not limited to vaginal bleeding, contractions, leaking of fluid and fetal movement were reviewed in detail with the patient.  All questions were answered.   Follow-up: Return for As  scheduled MOn efw/bpp/dopp u/s and HROB.   Future Appointments  Date Time Provider Department Center  05/12/2020  3:30 PM [facility] [place] [facility] None  05/12/2020  4:20 PM [name], CNM [facility]  05/15/2020  2:50 PM [facility] NURSE [facility]  05/19/2020  2:00 PM [facility] [place] [facility] None  05/19/2020  3:00 PM Lazaro Arms, MD [facility]  05/22/2020  2:50 PM [facility] NURSE [facility]  05/26/2020  1:30 PM [facility] [place] [facility] None  05/26/2020  2:50 PM Lazaro Arms, MD [facility]  05/29/2020  2:50 PM [facility] NURSE [facility]    Orders Placed This Encounter  Procedures  . US OB Follow Up  . US FETAL BPP WO NON STRESS  . [place] UA Cord Doppler  . Hepatitis B surface antigen  . Hepatitis C antibody  . Rubella screen  . POC Urinalysis Dipstick OB  . ABO/Rh   [name] CNM, [facility] 05/06/2020 12:48 PM

## 2020-05-06 NOTE — Patient Instructions (Signed)
Denise Conner, I greatly value your feedback.  If you receive a survey following your visit with [place] today, we appreciate you taking the time to fill it out.  Thanks, [name], CNM, WHNP-BC  [facility] ([address] [place], [place] [address]) Entrance C, located off of [address] valet parking   Go to [email or web address] to register for FREE online childbirth classes    Call the office [phone]) or go to [facility] if:  You begin to have strong, frequent contractions  Your water breaks.  Sometimes it is a big gush of fluid, sometimes it is just a trickle that keeps getting your panties wet or running down your legs  You have vaginal bleeding.  It is normal to have a small amount of spotting if your cervix was checked.   You don't feel your baby moving like normal.  If you don't, get you something to eat and drink and lay down and focus on feeling your baby move.  You should feel at least 10 movements in 2 hours.  If you don't, you should call the office or go to [facility].   Call the office [phone]) or go to [facility] hospital for these signs of pre-eclampsia:  Severe headache that does not go away with Tylenol  Visual changes- seeing spots, double, blurred vision  Pain under your right breast or upper abdomen that does not go away with Tums or heartburn medicine  Nausea and/or vomiting  Severe swelling in your hands, feet, and face    Home Blood Pressure Monitoring for Patients   Your provider has recommended that you check your blood pressure (BP) at least once a week at home. If you do not have a blood pressure cuff at home, one will be provided for you. Contact your provider if you have not received your monitor within 1 week.   Helpful Tips for Accurate Home Blood Pressure Checks  . Don't smoke, exercise, or drink caffeine 30 minutes before checking your BP . Use the restroom before checking your BP (a full bladder  can raise your pressure) . Relax in a comfortable upright chair . Feet on the ground . Left arm resting comfortably on a flat surface at the level of your heart . Legs uncrossed . Back supported . Sit quietly and don't talk . Place the cuff on your bare arm . Adjust snuggly, so that only two fingertips can fit between your skin and the top of the cuff . Check 2 readings separated by at least one minute . Keep a log of your BP readings . For a visual, please reference this diagram: http://ccnc.care/bpdiagram  Provider Name: [facility]     Phone: [phone]  Zone 1: ALL CLEAR  Continue to monitor your symptoms:  . BP reading is less than 140 (top number) or less than 90 (bottom number)  . No right upper stomach pain . No headaches or seeing spots . No feeling nauseated or throwing up . No swelling in face and hands  Zone 2: CAUTION Call your doctor's office for any of the following:  . BP reading is greater than 140 (top number) or greater than 90 (bottom number)  . Stomach pain under your ribs in the middle or right side . Headaches or seeing spots . Feeling nauseated or throwing up . Swelling in face and hands  Zone 3: EMERGENCY  Seek immediate medical care if you have any of the  following:  . BP reading is greater than160 (top number) or greater than 110 (bottom number) . Severe headaches not improving with Tylenol . Serious difficulty catching your breath . Any worsening symptoms from Zone 2  Preterm Labor and Birth Information  The normal length of a pregnancy is 39-41 weeks. Preterm labor is when labor starts before 37 completed weeks of pregnancy. What are the risk factors for preterm labor? Preterm labor is more likely to occur in women who:  Have certain infections during pregnancy such as a bladder infection, sexually transmitted infection, or infection inside the uterus (chorioamnionitis).  Have a shorter-than-normal cervix.  Have gone into preterm  labor before.  Have had surgery on their cervix.  Are younger than age 69 or older than age 11.  Are African American.  Are pregnant with twins or multiple babies (multiple gestation).  Take street drugs or smoke while pregnant.  Do not gain enough weight while pregnant.  Became pregnant shortly after having been pregnant. What are the symptoms of preterm labor? Symptoms of preterm labor include:  Cramps similar to those that can happen during a menstrual period. The cramps may happen with diarrhea.  Pain in the abdomen or lower back.  Regular uterine contractions that may feel like tightening of the abdomen.  A feeling of increased pressure in the pelvis.  Increased watery or bloody mucus discharge from the vagina.  Water breaking (ruptured amniotic sac). Why is it important to recognize signs of preterm labor? It is important to recognize signs of preterm labor because babies who are born prematurely may not be fully developed. This can put them at an increased risk for:  Long-term (chronic) heart and lung problems.  Difficulty immediately after birth with regulating body systems, including blood sugar, body temperature, heart rate, and breathing rate.  Bleeding in the brain.  Cerebral palsy.  Learning difficulties.  Death. These risks are highest for babies who are born before 41 weeks of pregnancy. How is preterm labor treated? Treatment depends on the length of your pregnancy, your condition, and the health of your baby. It may involve: 1. Having a stitch (suture) placed in your cervix to prevent your cervix from opening too early (cerclage). 2. Taking or being given medicines, such as: ? Hormone medicines. These may be given early in pregnancy to help support the pregnancy. ? Medicine to stop contractions. ? Medicines to help mature the baby's lungs. These may be prescribed if the risk of delivery is high. ? Medicines to prevent your baby from developing  cerebral palsy. If the labor happens before 34 weeks of pregnancy, you may need to stay in the hospital. What should I do if I think I am in preterm labor? If you think that you are going into preterm labor, call your health care provider right away. How can I prevent preterm labor in future pregnancies? To increase your chance of having a full-term pregnancy:  Do not use any tobacco products, such as cigarettes, chewing tobacco, and e-cigarettes. If you need help quitting, ask your health care provider.  Do not use street drugs or medicines that have not been prescribed to you during your pregnancy.  Talk with your health care provider before taking any herbal supplements, even if you have been taking them regularly.  Make sure you gain a healthy amount of weight during your pregnancy.  Watch for infection. If you think that you might have an infection, get it checked right away.  Make sure to tell  your health care provider if you have gone into preterm labor before. This information is not intended to replace advice given to you by your health care provider. Make sure you discuss any questions you have with your health care provider. Document Revised: 08/25/2018 Document Reviewed: 09/24/2015 Elsevier Patient Education  [place].

## 2020-05-07 ENCOUNTER — Encounter: Payer: Self-pay | Admitting: Women's Health

## 2020-05-07 DIAGNOSIS — B1920 Unspecified viral hepatitis C without hepatic coma: Secondary | ICD-10-CM | POA: Insufficient documentation

## 2020-05-07 LAB — RUBELLA SCREEN: Rubella Antibodies, IGG: 1.29 index (ref >0.99)

## 2020-05-07 LAB — ABO/RH

## 2020-05-07 LAB — HEPATITIS C ANTIBODY: Hep C Virus Ab: >11 s/co ratio — ABNORMAL HIGH (ref 0.0–0.9)

## 2020-05-07 LAB — HEPATITIS B SURFACE ANTIGEN: Hepatitis B Surface Ag: NEGATIVE

## 2020-05-12 ENCOUNTER — Encounter: Payer: Self-pay | Admitting: Women's Health

## 2020-05-12 ENCOUNTER — Ambulatory Visit (INDEPENDENT_AMBULATORY_CARE_PROVIDER_SITE_OTHER): Payer: Medicaid Other

## 2020-05-12 ENCOUNTER — Ambulatory Visit (INDEPENDENT_AMBULATORY_CARE_PROVIDER_SITE_OTHER): Payer: Medicaid Other | Admitting: Women's Health

## 2020-05-12 ENCOUNTER — Other Ambulatory Visit: Payer: Self-pay

## 2020-05-12 VITALS — BP 138/74 | HR 74 | Wt 191.4 lb

## 2020-05-12 DIAGNOSIS — O09523 Supervision of elderly multigravida, third trimester: Secondary | ICD-10-CM | POA: Diagnosis not present

## 2020-05-12 DIAGNOSIS — O365930 Maternal care for other known or suspected poor fetal growth, third trimester, not applicable or unspecified: Secondary | ICD-10-CM

## 2020-05-12 DIAGNOSIS — O4403 Placenta previa specified as without hemorrhage, third trimester: Secondary | ICD-10-CM

## 2020-05-12 DIAGNOSIS — Z331 Pregnant state, incidental: Secondary | ICD-10-CM

## 2020-05-12 DIAGNOSIS — O99320 Drug use complicating pregnancy, unspecified trimester: Secondary | ICD-10-CM

## 2020-05-12 DIAGNOSIS — Z3A34 34 weeks gestation of pregnancy: Secondary | ICD-10-CM

## 2020-05-12 DIAGNOSIS — O0993 Supervision of high risk pregnancy, unspecified, third trimester: Secondary | ICD-10-CM

## 2020-05-12 DIAGNOSIS — B1920 Unspecified viral hepatitis C without hepatic coma: Secondary | ICD-10-CM

## 2020-05-12 DIAGNOSIS — F1120 Opioid dependence, uncomplicated: Secondary | ICD-10-CM

## 2020-05-12 DIAGNOSIS — O365990 Maternal care for other known or suspected poor fetal growth, unspecified trimester, not applicable or unspecified: Secondary | ICD-10-CM | POA: Insufficient documentation

## 2020-05-12 DIAGNOSIS — Z1389 Encounter for screening for other disorder: Secondary | ICD-10-CM

## 2020-05-12 LAB — POCT URINALYSIS DIPSTICK OB
Blood, UA: NEGATIVE
Glucose, UA: NEGATIVE
Ketones, UA: NEGATIVE
Leukocytes, UA: NEGATIVE
Nitrite, UA: NEGATIVE
POC,PROTEIN,UA: NEGATIVE

## 2020-05-12 NOTE — Patient Instructions (Signed)
Denise Conner, I greatly value your feedback.  If you receive a survey following your visit with [place] today, we appreciate you taking the time to fill it out.  Thanks, Denise Conner, CNM, WHNP-BC  [facility] ([address] [place], [place] [address]) Entrance C, located off of [address] valet parking   Go to [email or web address] to register for FREE online childbirth classes    Call the office [phone]) or go to [facility] if:  You begin to have strong, frequent contractions  Your water breaks.  Sometimes it is a big gush of fluid, sometimes it is just a trickle that keeps getting your panties wet or running down your legs  You have vaginal bleeding.  It is normal to have a small amount of spotting if your cervix was checked.   You don't feel your baby moving like normal.  If you don't, get you something to eat and drink and lay down and focus on feeling your baby move.  You should feel at least 10 movements in 2 hours.  If you don't, you should call the office or go to [facility].   Call the office [phone]) or go to [facility] hospital for these signs of pre-eclampsia:  Severe headache that does not go away with Tylenol  Visual changes- seeing spots, double, blurred vision  Pain under your right breast or upper abdomen that does not go away with Tums or heartburn medicine  Nausea and/or vomiting  Severe swelling in your hands, feet, and face    Home Blood Pressure Monitoring for Patients   Your provider has recommended that you check your blood pressure (BP) at least once a week at home. If you do not have a blood pressure cuff at home, one will be provided for you. Contact your provider if you have not received your monitor within 1 week.   Helpful Tips for Accurate Home Blood Pressure Checks  . Don't smoke, exercise, or drink caffeine 30 minutes before checking your BP . Use the restroom before checking your BP (a full bladder  can raise your pressure) . Relax in a comfortable upright chair . Feet on the ground . Left arm resting comfortably on a flat surface at the level of your heart . Legs uncrossed . Back supported . Sit quietly and don't talk . Place the cuff on your bare arm . Adjust snuggly, so that only two fingertips can fit between your skin and the top of the cuff . Check 2 readings separated by at least one minute . Keep a log of your BP readings . For a visual, please reference this diagram: http://ccnc.care/bpdiagram  Provider Name: [facility]     Phone: [phone]  Zone 1: ALL CLEAR  Continue to monitor your symptoms:  . BP reading is less than 140 (top number) or less than 90 (bottom number)  . No right upper stomach pain . No headaches or seeing spots . No feeling nauseated or throwing up . No swelling in face and hands  Zone 2: CAUTION Call your doctor's office for any of the following:  . BP reading is greater than 140 (top number) or greater than 90 (bottom number)  . Stomach pain under your ribs in the middle or right side . Headaches or seeing spots . Feeling nauseated or throwing up . Swelling in face and hands  Zone 3: EMERGENCY  Seek immediate medical care if you have any of the  following:  . BP reading is greater than160 (top number) or greater than 110 (bottom number) . Severe headaches not improving with Tylenol . Serious difficulty catching your breath . Any worsening symptoms from Zone 2  Preterm Labor and Birth Information  The normal length of a pregnancy is 39-41 weeks. Preterm labor is when labor starts before 37 completed weeks of pregnancy. What are the risk factors for preterm labor? Preterm labor is more likely to occur in women who:  Have certain infections during pregnancy such as a bladder infection, sexually transmitted infection, or infection inside the uterus (chorioamnionitis).  Have a shorter-than-normal cervix.  Have gone into preterm  labor before.  Have had surgery on their cervix.  Are younger than age 69 or older than age 11.  Are African American.  Are pregnant with twins or multiple babies (multiple gestation).  Take street drugs or smoke while pregnant.  Do not gain enough weight while pregnant.  Became pregnant shortly after having been pregnant. What are the symptoms of preterm labor? Symptoms of preterm labor include:  Cramps similar to those that can happen during a menstrual period. The cramps may happen with diarrhea.  Pain in the abdomen or lower back.  Regular uterine contractions that may feel like tightening of the abdomen.  A feeling of increased pressure in the pelvis.  Increased watery or bloody mucus discharge from the vagina.  Water breaking (ruptured amniotic sac). Why is it important to recognize signs of preterm labor? It is important to recognize signs of preterm labor because babies who are born prematurely may not be fully developed. This can put them at an increased risk for:  Long-term (chronic) heart and lung problems.  Difficulty immediately after birth with regulating body systems, including blood sugar, body temperature, heart rate, and breathing rate.  Bleeding in the brain.  Cerebral palsy.  Learning difficulties.  Death. These risks are highest for babies who are born before 41 weeks of pregnancy. How is preterm labor treated? Treatment depends on the length of your pregnancy, your condition, and the health of your baby. It may involve: 1. Having a stitch (suture) placed in your cervix to prevent your cervix from opening too early (cerclage). 2. Taking or being given medicines, such as: ? Hormone medicines. These may be given early in pregnancy to help support the pregnancy. ? Medicine to stop contractions. ? Medicines to help mature the baby's lungs. These may be prescribed if the risk of delivery is high. ? Medicines to prevent your baby from developing  cerebral palsy. If the labor happens before 34 weeks of pregnancy, you may need to stay in the hospital. What should I do if I think I am in preterm labor? If you think that you are going into preterm labor, call your health care provider right away. How can I prevent preterm labor in future pregnancies? To increase your chance of having a full-term pregnancy:  Do not use any tobacco products, such as cigarettes, chewing tobacco, and e-cigarettes. If you need help quitting, ask your health care provider.  Do not use street drugs or medicines that have not been prescribed to you during your pregnancy.  Talk with your health care provider before taking any herbal supplements, even if you have been taking them regularly.  Make sure you gain a healthy amount of weight during your pregnancy.  Watch for infection. If you think that you might have an infection, get it checked right away.  Make sure to tell  your health care provider if you have gone into preterm labor before. This information is not intended to replace advice given to you by your health care provider. Make sure you discuss any questions you have with your health care provider. Document Revised: 08/25/2018 Document Reviewed: 09/24/2015 Elsevier Patient Education  2020 Elsevier Inc.   Intrauterine Growth Restriction  Intrauterine growth restriction (IUGR) is when a baby is not growing normally during pregnancy. A baby with IUGR is smaller than it should be and may weigh less than normal at birth. IUGR can result from a problem with the organ that supplies the unborn baby (fetus) with oxygen and nutrition (placenta). Usually, there is no way to prevent this type of problem. Babies with IUGR are at higher risk for early delivery and needing special (intensive) care after birth. What are the causes? The most common cause of IUGR is a problem with the placenta or umbilical cord that causes the fetus to get less oxygen or nutrition  than needed. Other causes include:  The mother eating a very unhealthy diet (poor maternal nutrition).  Exposure to chemicals found in substances such as cigarettes, alcohol, and some drugs.  Some prescription medicines.  Other problems that develop in the womb (congenital birth defects).  Genetic disorders.  Infection.  Carrying more than one baby. What increases the risk? This condition is more likely to affect babies of mothers who:  Are over the age of 41 at the time of delivery.  Are younger than age 61 at the time of delivery.  Have medical conditions such as high blood pressure, preeclampsia, diabetes, heart or kidney disease, systemic lupus erythematosus, or anemia.  Live at a very high altitude during pregnancy.  Have a personal history or family history of: ? IUGR. ? A genetic disorder.  Take medicines during pregnancy that are related to congenital disabilities.  Come into contact with infected cat feces (toxoplasmosis).  Come into contact with chickenpox (varicella) or [place] measles (rubella).  Have or are at risk of getting an infectious disease such as syphilis, HIV, or herpes.  Eat an unhealthy diet during pregnancy.  Weigh less than 100 pounds.  Have had treatments to help her have children (infertility treatments).  Use tobacco, drugs, or alcohol during pregnancy. What are the signs or symptoms? IUGR does not cause many symptoms. You might notice that your baby does not move or kick very often. Also, your belly may not be as big as expected for the stage of your pregnancy. How is this diagnosed? This condition is diagnosed with physical exams and prenatal exams. You may also have:  Fundal measurements to check the size of your uterus.  An ultrasound done to measure your baby's size compared to the size of other babies at the same stage of development (gestational age). Your health care provider will monitor your baby's growth with ultrasounds  throughout pregnancy. You may also have tests to find the cause of IUGR. These may include:  Amniocentesis. This is a procedure that involves passing a needle into the uterus to collect a sample of fluid that surrounds the fetus (amniotic fluid). This may be done to check for signs of infection or congenital defects.  Tests to evaluate blood flow to your baby and placenta. How is this treated? In most cases, the goal of treatment is to treat the cause of IUGR. Your health care providers will monitor your pregnancy closely and help you manage your pregnancy. If your condition is caused by a placenta  problem and your baby is not getting enough blood, you may need:  Medicine to start labor and deliver your baby early (induction).  Cesarean delivery, also called a C-section. In this procedure, your baby is delivered through an incision in your abdomen and uterus. Follow these instructions at home: Medicines  Take over-the-counter and prescription medicines only as told by your health care provider. This includes vitamins and supplements.  Make sure that your health care provider knows about and approves of all the medicines, supplements, vitamins, eye drops, and creams that you use. General instructions  Eat a healthy diet that includes fresh fruits and vegetables, lean proteins, whole grains, and calcium-rich foods such as milk, yogurt, and dark, leafy greens. Work with your health care provider or a dietitian to make sure that: ? You are getting enough nutrients. ? You are gaining enough weight.  Rest as needed. Try to get at least 8 hours of sleep every night.  Do not drink alcohol or use drugs.  Do not use any products that contain nicotine or tobacco, such as cigarettes and e-cigarettes. If you need help quitting, ask your health care provider.  Keep all follow-up visits as told by your health care provider. This is important. Get help right away if you:  Notice that your baby is  moving less than usual or is not moving.  Have contractions that are 5 minutes or less apart, or that increase in frequency, intensity, or length.  Have signs and symptoms of infection, including a fever.  Have vaginal bleeding.  Have increased swelling in your legs, hands, or face.  Have vision changes, including seeing spots or having blurry or double vision.  Have a severe headache that does not go away.  Have sudden, sharp abdominal pain or low back pain.  Have an uncontrolled gush or trickle of fluid from your vagina. Summary  Intrauterine growth restriction (IUGR) is when a baby is not growing normally during pregnancy.  The most common cause of IUGR is a problem with the placenta or umbilical cord that causes the fetus to get less oxygen or nutrition than needed.  This condition is diagnosed with physical and prenatal exams. Your health care provider will monitor your baby's growth with ultrasounds throughout pregnancy.  Make sure that your health care provider knows about and approves of all the medicines, supplements, vitamins, eye drops, and creams that you use. This information is not intended to replace advice given to you by your health care provider. Make sure you discuss any questions you have with your health care provider. Document Revised: 04/15/2017 Document Reviewed: 03/03/2017 Elsevier Patient Education  2020 [employer].

## 2020-05-12 NOTE — Progress Notes (Signed)
HIGH-RISK PREGNANCY VISIT Patient name: Denise Conner MRN [id number]  Date of birth: 1975/10/25 Chief Complaint:   Routine Prenatal Visit  History of Present Illness:   Denise Conner is a 44 y.o. [id number] female at [redacted]w[redacted]d with an Estimated Date of Delivery: 06/18/20 being seen today for ongoing management of a high-risk pregnancy complicated by advanced maternal age, fetal growth restriction 2.5% today, complete placenta previa, and suboxone maintenance, new dx HepC.  Today she reports initiated care w/ Dr. Cathey Endow last week, rx'd suboxone 2mg  BID- is having issue w/ insurance/getting rx. Contacted Dr. today.  Depression screen PHQ 2/9 03/21/2020  Decreased Interest 1  Down, Depressed, Hopeless 0  PHQ - 2 Score 1  Altered sleeping 2  Tired, decreased energy 3  Change in appetite 0  Feeling bad or failure about yourself  0  Trouble concentrating 1  Moving slowly or fidgety/restless 0  Suicidal thoughts 0  PHQ-9 Score 7    Contractions: Not present. Vag. Bleeding: Scant.  Movement: Present. denies leaking of fluid.  Review of Systems:   Pertinent items are noted in HPI Denies abnormal vaginal discharge w/ itching/odor/irritation, headaches, visual changes, shortness of breath, chest pain, abdominal pain, severe nausea/vomiting, or problems with urination or bowel movements unless otherwise stated above. Pertinent History Reviewed:  Reviewed past medical,surgical, social, obstetrical and family history.  Reviewed problem list, medications and allergies. Physical Assessment:   Vitals:   05/12/20 1635  BP: 138/74  Pulse: 74  Weight: 191 lb 6.4 oz (86.8 kg)  Body mass index is 29.54 kg/m.           Physical Examination:   General appearance: alert, well appearing, and in no distress  Mental status: alert, oriented to person, place, and time  Skin: warm & dry   Extremities: Edema: None    Cardiovascular: normal heart rate noted  Respiratory: normal respiratory effort, no  distress  Abdomen: gravid, soft, non-tender  Pelvic: Cervical exam deferred         Fetal Status: Fetal Heart Rate (bpm): 138 u/s   Movement: Present    Fetal Surveillance Testing today: 05/14/20 34+5 wks,cephalic,BPP 8/8,posterior placenta previa gr 2 (limited view),fhr 138 bpm,RI .62,.68,.65,.66=82%,EFW 1874 g 2.5%,afi 11.8 cm  Chaperone: N/A    Results for orders placed or performed in visit on 05/12/20 (from the past 24 hour(s))  POC Urinalysis Dipstick OB   Collection Time: 05/12/20  4:25 PM  Result Value Ref Range   Color, UA     Clarity, UA     Glucose, UA Negative Negative   Bilirubin, UA     Ketones, UA neg    Spec Grav, UA     Blood, UA neg    pH, UA     POC,PROTEIN,UA Negative Negative, Trace, Small (1+), Moderate (2+), Large (3+), 4+   Urobilinogen, UA     Nitrite, UA neg    Leukocytes, UA Negative Negative   Appearance     Odor      Assessment & Plan:  High-risk pregnancy: 05/14/20 at [redacted]w[redacted]d with an Estimated Date of Delivery: 06/18/20   1) AMA 44yo, stable  2) Severe FGR 2.5% w/ AC 1.8%, today (last u/s @ 30wks FGR was 9.3%). Dopplers 82%, normal AFI, bpp 8/8. Discussed w/ LHE, needs delivery @ 37wks  3) Complete posterior previa> pelvic rest  4) Suboxone maintenance> initiated care w/Dr. 08/16/20 last week, rx'd suboxone 8mg  strips BID  5) Prev c/s> for RCS w/ BTL  6) New dx HepC>  discussed, will refer to GI pp. CMP and HepC quant today  Meds: No orders of the defined types were placed in this encounter.   Labs/procedures today: u/s  Treatment Plan:  2x/wk testing nst alt w/ bpp/dopp, RCS @ 37wks or earlier if indicated  Reviewed: Preterm labor symptoms and general obstetric precautions including but not limited to vaginal bleeding, contractions, leaking of fluid and fetal movement were reviewed in detail with the patient.  All questions were answered.   Follow-up: Return for thurs nst/nurse, then mon bpp/dopp and hrob w/ md.   Future Appointments  Date Time  Provider Department Center  05/15/2020  2:50 PM [facility] NURSE [facility]  05/19/2020  2:00 PM [facility] [place] [facility] None  05/19/2020  3:00 PM Lazaro Arms, MD [facility]  05/22/2020  2:50 PM [facility] NURSE [facility]  05/26/2020  1:30 PM [facility] [place] [facility] None  05/26/2020  2:50 PM Lazaro Arms, MD [facility]  05/29/2020  2:50 PM [facility] NURSE [facility]    Orders Placed This Encounter  Procedures  . Comprehensive metabolic panel  . HCV RNA quant  . POC Urinalysis Dipstick OB   [name] CNM, [facility] 05/12/2020 5:06 PM

## 2020-05-12 NOTE — Progress Notes (Signed)
[place] 34+5 wks,cephalic,BPP 8/8,posterior placenta previa gr 2 (limited view),fhr 138 bpm,RI .62,.68,.65,.66=82%,EFW 1874 g 2.5%,afi 11.8 cm

## 2020-05-13 ENCOUNTER — Telehealth (HOSPITAL_COMMUNITY): Payer: Self-pay | Admitting: *Deleted

## 2020-05-13 NOTE — Patient Instructions (Signed)
Denise Conner  05/13/2020   Your procedure is scheduled on:  05/23/2019  Arrive at 0530 at Entrance C on [employer] [address] at [facility]  and [employer]. You are invited to use the FREE valet parking or use the Visitor's parking deck.  Pick up the phone at the desk and dial [phone].  Call this number if you have problems the morning of surgery: [phone]  Remember:   Do not eat food:(After Midnight) Desps de medianoche.  Do not drink clear liquids: (After Midnight) Desps de medianoche.  Take these medicines the morning of surgery with A SIP OF WATER:  Take suboxone as prescribed   Do not wear jewelry, make-up or nail polish.  Do not wear lotions, powders, or perfumes. Do not wear deodorant.  Do not shave 48 hours prior to surgery.  Do not bring valuables to the hospital.  [facility] is not   responsible for any belongings or valuables brought to the hospital.  Contacts, dentures or bridgework may not be worn into surgery.  Leave suitcase in the car. After surgery it may be brought to your room.  For patients admitted to the hospital, checkout time is 11:00 AM the day of              discharge.      Please read over the following fact sheets that you were given:     Preparing for Surgery

## 2020-05-13 NOTE — Telephone Encounter (Signed)
Preadmission screen  

## 2020-05-14 ENCOUNTER — Encounter: Payer: Self-pay | Admitting: *Deleted

## 2020-05-14 ENCOUNTER — Encounter (HOSPITAL_COMMUNITY): Payer: Self-pay

## 2020-05-14 DIAGNOSIS — O0993 Supervision of high risk pregnancy, unspecified, third trimester: Secondary | ICD-10-CM

## 2020-05-14 LAB — COMPREHENSIVE METABOLIC PANEL
ALT: 8 IU/L (ref 0–32)
AST: 14 IU/L (ref 0–40)
Albumin/Globulin Ratio: 1.1 — ABNORMAL LOW (ref 1.2–2.2)
Albumin: 3.1 g/dL — ABNORMAL LOW (ref 3.8–4.8)
Alkaline Phosphatase: 112 IU/L (ref 44–121)
BUN/Creatinine Ratio: 17 (ref 9–23)
BUN: 9 mg/dL (ref 6–24)
Bilirubin Total: <0.2 mg/dL (ref 0.0–1.2)
CO2: 20 mmol/L (ref 20–29)
Calcium: 8.1 mg/dL — ABNORMAL LOW (ref 8.7–10.2)
Chloride: 105 mmol/L (ref 96–106)
Creatinine, Ser: 0.53 mg/dL — ABNORMAL LOW (ref 0.57–1.00)
GFR calc Af Amer: 134 mL/min/{1.73_m2} (ref >59)
GFR calc non Af Amer: 117 mL/min/{1.73_m2} (ref >59)
Globulin, Total: 2.8 g/dL (ref 1.5–4.5)
Glucose: 80 mg/dL (ref 65–99)
Potassium: 3.9 mmol/L (ref 3.5–5.2)
Sodium: 138 mmol/L (ref 134–144)
Total Protein: 5.9 g/dL — ABNORMAL LOW (ref 6.0–8.5)

## 2020-05-14 LAB — HCV RNA QUANT
HCV log10: 4.823 log10 IU/mL
Hepatitis C Quantitation: 66500 IU/mL

## 2020-05-15 ENCOUNTER — Ambulatory Visit: Payer: Medicaid Other | Admitting: *Deleted

## 2020-05-15 ENCOUNTER — Other Ambulatory Visit: Payer: Self-pay

## 2020-05-15 ENCOUNTER — Other Ambulatory Visit: Payer: Self-pay | Admitting: Obstetrics & Gynecology

## 2020-05-15 VITALS — BP 128/76 | HR 66

## 2020-05-15 DIAGNOSIS — O365930 Maternal care for other known or suspected poor fetal growth, third trimester, not applicable or unspecified: Secondary | ICD-10-CM

## 2020-05-15 DIAGNOSIS — O09529 Supervision of elderly multigravida, unspecified trimester: Secondary | ICD-10-CM

## 2020-05-15 DIAGNOSIS — O0993 Supervision of high risk pregnancy, unspecified, third trimester: Secondary | ICD-10-CM

## 2020-05-15 NOTE — Progress Notes (Signed)
   NURSE VISIT- NST  SUBJECTIVE:  Denise Conner is a 44 y.o. [phone] female at [redacted]w[redacted]d, here for a NST for pregnancy complicated by AMA.  She reports active fetal movement, contractions: none, vaginal bleeding: none, membranes: intact.   OBJECTIVE:  BP 128/76   Pulse 66   LMP  (LMP Unknown)   Appears well, no apparent distress  No results found for this or any previous visit (from the past 24 hour(s)).  NST: FHR baseline 110 bpm, Variability: moderate, Accelerations:present, Decelerations:  Absent= Cat 1/Reactive Toco: none   ASSESSMENT: [id number] at [redacted]w[redacted]d with AMA NST reactive  PLAN: EFM strip reviewed by Cathie Beams, CNM   Recommendations: keep next appointment as scheduled    Annamarie Dawley  05/15/2020 5:04 PM

## 2020-05-19 ENCOUNTER — Other Ambulatory Visit: Payer: Medicaid Other

## 2020-05-19 ENCOUNTER — Telehealth: Payer: Self-pay | Admitting: Obstetrics & Gynecology

## 2020-05-19 ENCOUNTER — Encounter: Payer: Medicaid Other | Admitting: Obstetrics & Gynecology

## 2020-05-19 NOTE — Telephone Encounter (Signed)
Returned pt's call and informed her that her c-section would be rescheduled per Dr. Despina Hidden on Wednesday, 05/28/20. The NST's are being scheduled for this week. Pt confirms understanding.  Pt's NST's scheduled for 1/4 at 1550 and 1/7 at 1250. Pt called back and informed of new appt scheduling.

## 2020-05-19 NOTE — Telephone Encounter (Signed)
Patient called to inform Dr. Despina Hidden that she tested positive for Covid and has a cough and running nose. Patient is scheduled for C-Section on 05/23/2019. Per Patient. Clinical staff will follow up with patient.

## 2020-05-19 NOTE — Patient Instructions (Addendum)
Lyza Houseworth  05/19/2020   Your procedure is scheduled on:  05/28/2020  Arrive at 0915 at Entrance C on [address] at [facility]  and [employer]. You are invited to use the FREE valet parking or use the Visitor's parking deck.  Pick up the phone at the desk and dial [phone].  Call this number if you have problems the morning of surgery: [phone]  Remember:   Do not eat food:(After Midnight) Desps de medianoche.  Do not drink clear liquids: (After Midnight) Desps de medianoche.  Take these medicines the morning of surgery with A SIP OF WATER:  Take suboxone as prescribed   Do not wear jewelry, make-up or nail polish.  Do not wear lotions, powders, or perfumes. Do not wear deodorant.  Do not shave 48 hours prior to surgery.  Do not bring valuables to the hospital.  [facility] is not   responsible for any belongings or valuables brought to the hospital.  Contacts, dentures or bridgework may not be worn into surgery.  Leave suitcase in the car. After surgery it may be brought to your room.  For patients admitted to the hospital, checkout time is 11:00 AM the day of              discharge.      Please read over the following fact sheets that you were given:     Preparing for Surgery

## 2020-05-20 ENCOUNTER — Encounter (HOSPITAL_COMMUNITY): Payer: Self-pay

## 2020-05-20 ENCOUNTER — Other Ambulatory Visit (HOSPITAL_COMMUNITY): Payer: Medicaid Other

## 2020-05-20 ENCOUNTER — Encounter (HOSPITAL_COMMUNITY)
Admission: RE | Admit: 2020-05-20 | Discharge: 2020-05-20 | Disposition: A | Discharge status: Home / Self Care | Payer: Medicaid Other | Source: Ambulatory Visit | Attending: Obstetrics & Gynecology | Admitting: Obstetrics & Gynecology

## 2020-05-20 ENCOUNTER — Other Ambulatory Visit: Payer: Self-pay

## 2020-05-20 ENCOUNTER — Ambulatory Visit (INDEPENDENT_AMBULATORY_CARE_PROVIDER_SITE_OTHER): Payer: Medicaid Other | Admitting: *Deleted

## 2020-05-20 DIAGNOSIS — O0993 Supervision of high risk pregnancy, unspecified, third trimester: Secondary | ICD-10-CM | POA: Diagnosis not present

## 2020-05-20 DIAGNOSIS — O26899 Other specified pregnancy related conditions, unspecified trimester: Secondary | ICD-10-CM

## 2020-05-20 DIAGNOSIS — O365930 Maternal care for other known or suspected poor fetal growth, third trimester, not applicable or unspecified: Secondary | ICD-10-CM | POA: Diagnosis not present

## 2020-05-20 DIAGNOSIS — Z6791 Unspecified blood type, Rh negative: Secondary | ICD-10-CM

## 2020-05-20 NOTE — Progress Notes (Addendum)
   NURSE VISIT- NST  SUBJECTIVE:  Denise Conner is a 45 y.o. [phone] female at [redacted]w[redacted]d, here for a NST for pregnancy complicated by AMA and FGR.  She reports active fetal movement, contractions: none, vaginal bleeding: none, membranes: intact.   OBJECTIVE:  BP 132/81   Pulse 65   LMP  (LMP Unknown)   Appears well, no apparent distress  No results found for this or any previous visit (from the past 24 hour(s)).  NST: FHR baseline 115 bpm, Variability: moderate, Accelerations:present, Decelerations:  Absent= Cat 1/Reactive Toco: none   ASSESSMENT: [id number] at [redacted]w[redacted]d with AMA and FGR NST reactive  PLAN: EFM strip reviewed by Dr. Despina Hidden   Recommendations: keep next appointment as scheduled    [name]  05/20/2020 4:40 PM Attestation of Attending Supervision of Nursing Visit Encounter: Evaluation and management procedures were performed by the nursing staff under my supervision and collaboration.  I have reviewed the nurse's note and chart, and I agree with the management and plan.  Rockne Coons MD Attending Physician for the Center for [facility] 05/20/2020 5:39 PM

## 2020-05-21 ENCOUNTER — Other Ambulatory Visit: Payer: Self-pay | Admitting: Women's Health

## 2020-05-22 ENCOUNTER — Other Ambulatory Visit: Payer: Medicaid Other

## 2020-05-23 ENCOUNTER — Other Ambulatory Visit: Payer: Self-pay | Admitting: Obstetrics & Gynecology

## 2020-05-23 ENCOUNTER — Ambulatory Visit (INDEPENDENT_AMBULATORY_CARE_PROVIDER_SITE_OTHER): Payer: Medicaid Other | Admitting: *Deleted

## 2020-05-23 ENCOUNTER — Other Ambulatory Visit (HOSPITAL_COMMUNITY)
Admission: RE | Admit: 2020-05-23 | Discharge: 2020-05-23 | Disposition: A | Discharge status: Home / Self Care | Payer: Medicaid Other | Source: Ambulatory Visit | Attending: Obstetrics & Gynecology | Admitting: Obstetrics & Gynecology

## 2020-05-23 ENCOUNTER — Other Ambulatory Visit: Payer: Self-pay

## 2020-05-23 VITALS — BP 135/80 | HR 70

## 2020-05-23 DIAGNOSIS — Z3A36 36 weeks gestation of pregnancy: Secondary | ICD-10-CM | POA: Insufficient documentation

## 2020-05-23 DIAGNOSIS — O09893 Supervision of other high risk pregnancies, third trimester: Secondary | ICD-10-CM | POA: Insufficient documentation

## 2020-05-23 DIAGNOSIS — O365930 Maternal care for other known or suspected poor fetal growth, third trimester, not applicable or unspecified: Secondary | ICD-10-CM

## 2020-05-23 DIAGNOSIS — O0993 Supervision of high risk pregnancy, unspecified, third trimester: Secondary | ICD-10-CM

## 2020-05-23 DIAGNOSIS — O09523 Supervision of elderly multigravida, third trimester: Secondary | ICD-10-CM | POA: Diagnosis not present

## 2020-05-26 ENCOUNTER — Encounter (HOSPITAL_COMMUNITY): Payer: Self-pay | Admitting: Family Medicine

## 2020-05-26 ENCOUNTER — Inpatient Hospital Stay (HOSPITAL_COMMUNITY)
Admission: AD | Admit: 2020-05-26 | Discharge: 2020-05-28 | DRG: 783 | Discharge status: Against Medical Advice | Payer: Medicaid Other | Attending: Obstetrics & Gynecology | Admitting: Obstetrics & Gynecology

## 2020-05-26 ENCOUNTER — Ambulatory Visit (INDEPENDENT_AMBULATORY_CARE_PROVIDER_SITE_OTHER): Payer: Medicaid Other

## 2020-05-26 ENCOUNTER — Ambulatory Visit (INDEPENDENT_AMBULATORY_CARE_PROVIDER_SITE_OTHER): Payer: Medicaid Other | Admitting: Obstetrics & Gynecology

## 2020-05-26 ENCOUNTER — Other Ambulatory Visit: Payer: Self-pay

## 2020-05-26 ENCOUNTER — Encounter (HOSPITAL_COMMUNITY): Payer: Self-pay | Admitting: Anesthesiology

## 2020-05-26 ENCOUNTER — Telehealth: Payer: Self-pay | Admitting: Obstetrics & Gynecology

## 2020-05-26 ENCOUNTER — Encounter (HOSPITAL_COMMUNITY)
Admission: RE | Admit: 2020-05-26 | Discharge: 2020-05-26 | Disposition: A | Discharge status: Home / Self Care | Payer: Medicaid Other | Source: Ambulatory Visit | Attending: Obstetrics & Gynecology | Admitting: Obstetrics & Gynecology

## 2020-05-26 ENCOUNTER — Other Ambulatory Visit (HOSPITAL_COMMUNITY): Payer: Medicaid Other

## 2020-05-26 VITALS — BP 156/98 | HR 78

## 2020-05-26 DIAGNOSIS — O99334 Smoking (tobacco) complicating childbirth: Secondary | ICD-10-CM | POA: Diagnosis present

## 2020-05-26 DIAGNOSIS — O26899 Other specified pregnancy related conditions, unspecified trimester: Secondary | ICD-10-CM

## 2020-05-26 DIAGNOSIS — O26893 Other specified pregnancy related conditions, third trimester: Secondary | ICD-10-CM | POA: Diagnosis present

## 2020-05-26 DIAGNOSIS — Z6791 Unspecified blood type, Rh negative: Secondary | ICD-10-CM

## 2020-05-26 DIAGNOSIS — U071 COVID-19: Secondary | ICD-10-CM | POA: Diagnosis present

## 2020-05-26 DIAGNOSIS — O99324 Drug use complicating childbirth: Secondary | ICD-10-CM | POA: Diagnosis present

## 2020-05-26 DIAGNOSIS — O34211 Maternal care for low transverse scar from previous cesarean delivery: Secondary | ICD-10-CM | POA: Diagnosis present

## 2020-05-26 DIAGNOSIS — F1120 Opioid dependence, uncomplicated: Secondary | ICD-10-CM

## 2020-05-26 DIAGNOSIS — Z5329 Procedure and treatment not carried out because of patient's decision for other reasons: Secondary | ICD-10-CM | POA: Diagnosis present

## 2020-05-26 DIAGNOSIS — O09529 Supervision of elderly multigravida, unspecified trimester: Secondary | ICD-10-CM | POA: Diagnosis not present

## 2020-05-26 DIAGNOSIS — O99323 Drug use complicating pregnancy, third trimester: Secondary | ICD-10-CM | POA: Diagnosis not present

## 2020-05-26 DIAGNOSIS — O98513 Other viral diseases complicating pregnancy, third trimester: Secondary | ICD-10-CM | POA: Diagnosis not present

## 2020-05-26 DIAGNOSIS — O4403 Placenta previa specified as without hemorrhage, third trimester: Secondary | ICD-10-CM | POA: Diagnosis present

## 2020-05-26 DIAGNOSIS — Z79899 Other long term (current) drug therapy: Secondary | ICD-10-CM

## 2020-05-26 DIAGNOSIS — F17210 Nicotine dependence, cigarettes, uncomplicated: Secondary | ICD-10-CM | POA: Diagnosis present

## 2020-05-26 DIAGNOSIS — Z98891 History of uterine scar from previous surgery: Principal | ICD-10-CM

## 2020-05-26 DIAGNOSIS — O9852 Other viral diseases complicating childbirth: Secondary | ICD-10-CM | POA: Diagnosis present

## 2020-05-26 DIAGNOSIS — O09893 Supervision of other high risk pregnancies, third trimester: Secondary | ICD-10-CM | POA: Diagnosis not present

## 2020-05-26 DIAGNOSIS — F1920 Other psychoactive substance dependence, uncomplicated: Secondary | ICD-10-CM

## 2020-05-26 DIAGNOSIS — F1110 Opioid abuse, uncomplicated: Secondary | ICD-10-CM | POA: Diagnosis present

## 2020-05-26 DIAGNOSIS — O326XX0 Maternal care for compound presentation, not applicable or unspecified: Secondary | ICD-10-CM | POA: Diagnosis not present

## 2020-05-26 DIAGNOSIS — O365930 Maternal care for other known or suspected poor fetal growth, third trimester, not applicable or unspecified: Secondary | ICD-10-CM

## 2020-05-26 DIAGNOSIS — O0993 Supervision of high risk pregnancy, unspecified, third trimester: Secondary | ICD-10-CM

## 2020-05-26 DIAGNOSIS — Z3A36 36 weeks gestation of pregnancy: Secondary | ICD-10-CM

## 2020-05-26 DIAGNOSIS — O134 Gestational [pregnancy-induced] hypertension without significant proteinuria, complicating childbirth: Principal | ICD-10-CM | POA: Diagnosis present

## 2020-05-26 DIAGNOSIS — O99320 Drug use complicating pregnancy, unspecified trimester: Secondary | ICD-10-CM

## 2020-05-26 DIAGNOSIS — Z302 Encounter for sterilization: Secondary | ICD-10-CM | POA: Diagnosis not present

## 2020-05-26 DIAGNOSIS — O4400 Placenta previa specified as without hemorrhage, unspecified trimester: Secondary | ICD-10-CM | POA: Diagnosis present

## 2020-05-26 LAB — CBC
HCT: 31.6 % — ABNORMAL LOW (ref 36.0–46.0)
Hemoglobin: 9.6 g/dL — ABNORMAL LOW (ref 12.0–15.0)
MCH: 25.1 pg — ABNORMAL LOW (ref 26.0–34.0)
MCHC: 30.4 g/dL (ref 30.0–36.0)
MCV: 82.7 fL (ref 80.0–100.0)
Platelets: 212 10*3/uL (ref 150–400)
RBC: 3.82 MIL/uL — ABNORMAL LOW (ref 3.87–5.11)
RDW: 14.8 % (ref 11.5–15.5)
WBC: 10.9 10*3/uL — ABNORMAL HIGH (ref 4.0–10.5)
nRBC: 0 % (ref 0.0–0.2)

## 2020-05-26 LAB — PROTEIN / CREATININE RATIO, URINE
Creatinine, Urine: 118.57 mg/dL
Protein Creatinine Ratio: 0.12 mg/mg{Cre} (ref 0.00–0.15)
Total Protein, Urine: 14 mg/dL

## 2020-05-26 LAB — COMPREHENSIVE METABOLIC PANEL
ALT: 11 U/L (ref 0–44)
AST: 15 U/L (ref 15–41)
Albumin: 2.5 g/dL — ABNORMAL LOW (ref 3.5–5.0)
Alkaline Phosphatase: 117 U/L (ref 38–126)
Anion gap: 11 (ref 5–15)
BUN: 11 mg/dL (ref 6–20)
CO2: 17 mmol/L — ABNORMAL LOW (ref 22–32)
Calcium: 7.9 mg/dL — ABNORMAL LOW (ref 8.9–10.3)
Chloride: 104 mmol/L (ref 98–111)
Creatinine, Ser: 0.46 mg/dL (ref 0.44–1.00)
GFR, Estimated: >60 mL/min (ref >60)
Glucose, Bld: 81 mg/dL (ref 70–99)
Potassium: 3.4 mmol/L — ABNORMAL LOW (ref 3.5–5.1)
Sodium: 132 mmol/L — ABNORMAL LOW (ref 135–145)
Total Bilirubin: 0.5 mg/dL (ref 0.3–1.2)
Total Protein: 6.3 g/dL — ABNORMAL LOW (ref 6.5–8.1)

## 2020-05-26 LAB — POCT URINALYSIS DIPSTICK OB
Blood, UA: NEGATIVE
Glucose, UA: NEGATIVE
Ketones, UA: NEGATIVE
Leukocytes, UA: NEGATIVE
Nitrite, UA: NEGATIVE
POC,PROTEIN,UA: NEGATIVE

## 2020-05-26 MED ORDER — ACETAMINOPHEN 325 MG PO TABS
650.0000 mg | ORAL_TABLET | ORAL | Status: DC | PRN
  Administered 2020-05-27: 650 mg via ORAL
  Filled 2020-05-26: qty 2

## 2020-05-26 MED ORDER — CALCIUM CARBONATE ANTACID 500 MG PO CHEW: 2.0000 | CHEWABLE_TABLET | ORAL | Status: DC | PRN

## 2020-05-26 MED ORDER — ZOLPIDEM TARTRATE 5 MG PO TABS
5.0000 mg | ORAL_TABLET | Freq: Every evening | ORAL | Status: DC | PRN
  Administered 2020-05-26: 5 mg via ORAL
  Filled 2020-05-26: qty 1

## 2020-05-26 MED ORDER — FERROUS SULFATE 325 (65 FE) MG PO TABS
325.0000 mg | ORAL_TABLET | ORAL | Status: DC
  Administered 2020-05-28: 325 mg via ORAL
  Filled 2020-05-26: qty 1

## 2020-05-26 MED ORDER — OXYCODONE HCL 5 MG PO TABS
5.0000 mg | ORAL_TABLET | Freq: Once | ORAL | Status: AC
  Administered 2020-05-26: 5 mg via ORAL
  Filled 2020-05-26: qty 1

## 2020-05-26 MED ORDER — ACETAMINOPHEN 325 MG PO TABS
650.0000 mg | ORAL_TABLET | Freq: Four times a day (QID) | ORAL | Status: DC | PRN
  Administered 2020-05-26: 650 mg via ORAL
  Filled 2020-05-26: qty 2

## 2020-05-26 MED ORDER — LACTATED RINGERS IV SOLN: INTRAVENOUS | Status: DC

## 2020-05-26 MED ORDER — DOCUSATE SODIUM 100 MG PO CAPS
100.0000 mg | ORAL_CAPSULE | Freq: Every day | ORAL | Status: DC
  Administered 2020-05-28: 100 mg via ORAL
  Filled 2020-05-26: qty 1

## 2020-05-26 MED ORDER — PRENATAL MULTIVITAMIN CH: 1.0000 | ORAL_TABLET | Freq: Every day | ORAL | Status: DC

## 2020-05-26 NOTE — Telephone Encounter (Signed)
Pt called to request we move her c-section out about a week Her boyfriend (the baby's father) had a stroke yesterday & is now in the ICU at [facility] Patient, her son & her boyfriend all tested positive for Covid last week & her boyfriend is still testing positive (was tested yesterday at [facility])  She is on her was to her pre-op appointments in [place] today then headed to [facility] to see her boyfriend   Please advise & notify pt

## 2020-05-26 NOTE — Telephone Encounter (Signed)
Spoke with patient. She is coming today for her ultrasound and visit with Dr. Despina Hidden. Will discuss csection at that time per patient request.

## 2020-05-26 NOTE — Progress Notes (Signed)
MAU notified OR charge of patient's contacts. Patient stated her husband had a stroke yesterday (1/9) and would not be available for delivery. Contacts for patient for updates on care include the following:  Kandyce Rud (patient's Mom)- [phone] Marletta Lor (Patient's Sister)- [phone]

## 2020-05-26 NOTE — Progress Notes (Signed)
HIGH-RISK PREGNANCY VISIT Patient name: Denise Conner MRN [id number]  Date of birth: 02/28/1976 Chief Complaint:   Routine Prenatal Visit (BPP today)  History of Present Illness:   Denise Conner is a 45 y.o. [phone] female at [redacted]w[redacted]d with an Estimated Date of Delivery: 06/18/20 being seen today for ongoing management of a high-risk pregnancy complicated by complete previa, posterior,FGR, AMA, suboxone therapy and COVID 19.  Today she reports no complaints.  Depression screen PHQ 2/9 03/21/2020  Decreased Interest 1  Down, Depressed, Hopeless 0  PHQ - 2 Score 1  Altered sleeping 2  Tired, decreased energy 3  Change in appetite 0  Feeling bad or failure about yourself  0  Trouble concentrating 1  Moving slowly or fidgety/restless 0  Suicidal thoughts 0  PHQ-9 Score 7    Contractions: Not present. Vag. Bleeding: None.  Movement: Present. denies leaking of fluid.  Review of Systems:   Pertinent items are noted in HPI Denies abnormal vaginal discharge w/ itching/odor/irritation, headaches, visual changes, shortness of breath, chest pain, abdominal pain, severe nausea/vomiting, or problems with urination or bowel movements unless otherwise stated above. Pertinent History Reviewed:  Reviewed past medical,surgical, social, obstetrical and family history.  Reviewed problem list, medications and allergies. Physical Assessment:   Vitals:   05/26/20 1428 05/26/20 1431  BP: (!) 165/83 (!) 156/98  Pulse: 72 78  There is no height or weight on file to calculate BMI.           Physical Examination:   General appearance: alert, well appearing, and in no distress  Mental status: alert, oriented to person, place, and time  Skin: warm & dry   Extremities: Edema: None    Cardiovascular: normal heart rate noted  Respiratory: normal respiratory effort, no distress  Abdomen: gravid, soft, non-tender  Pelvic: Cervical exam deferred         Fetal Status:     Movement: Present    Fetal Surveillance  Testing today: BPP 8/8 with elevated Doppler flow 97% and periods of FHR in 100s   Chaperone: N/A    Results for orders placed or performed in visit on 05/26/20 (from the past 24 hour(s))  POC Urinalysis Dipstick OB   Collection Time: 05/26/20  2:30 PM  Result Value Ref Range   Color, UA     Clarity, UA     Glucose, UA Negative Negative   Bilirubin, UA     Ketones, UA neg    Spec Grav, UA     Blood, UA neg    pH, UA     POC,PROTEIN,UA Negative Negative, Trace, Small (1+), Moderate (2+), Large (3+), 4+   Urobilinogen, UA     Nitrite, UA neg    Leukocytes, UA Negative Negative   Appearance     Odor      Assessment & Plan:  High-risk pregnancy: [id number] at [redacted]w[redacted]d with an Estimated Date of Delivery: 06/18/20   1) FGR, elevated Dopplers today, and periods of FHR 100s  2) Complete posterior previa, stable  3)Elevated BP in office today borderline severe range with a heache  4) +COVID 19 7 days ago, asymptomatic  5)  +Hepatitis C diagnosed 05/12/20  Previous C section, desires BTL  Meds: No orders of the defined types were placed in this encounter.   Labs/procedures today:   Treatment Plan:  Recommend delivery today with deterioration in the fetal Doppler flow studies, 97%, episodic FHR 100s, complete previa, which was rescheduled from last week to this week  I discussed with Dr Adrian Blackwater and he is going to relay to Dr Crissie Reese, she is going to Going to MAU   Reviewed: Preterm labor symptoms and general obstetric precautions including but not limited to vaginal bleeding, contractions, leaking of fluid and fetal movement were reviewed in detail with the patient.  All questions were answered. Has home bp cuff. Rx faxed to . Check bp weekly, let us know if >140/90.   Follow-up: No follow-ups on file.   No future appointments.  Orders Placed This Encounter  Procedures  . POC Urinalysis Dipstick OB   Lazaro Arms 05/26/2020 3:42 PM

## 2020-05-26 NOTE — Progress Notes (Signed)
[place] 36+6 wks,cephalic,posterior placenta previa gr 3,AFI 8 cm,fhr 101,111,132 bpm,elevated UAD with diastolic flow,RI 03%,JKK 8/8
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
## 2020-05-26 NOTE — MAU Note (Signed)
.   Denise Conner is a 45 y.o. at [redacted]w[redacted]d here in MAU reporting: [facility]. Had elevated BP in the office and has had headache. No VB or LOF. States decreased fetal movement today.   Pain score: 8 Vitals:   05/26/20 1733  BP: (!) 152/93  Pulse: 71  Resp: (!) 21  Temp: 97.9 F (36.6 C)  SpO2: 100%     FHT:147

## 2020-05-26 NOTE — Progress Notes (Addendum)
   NURSE VISIT- NST  SUBJECTIVE:  Denise Conner is a 45 y.o. [phone] female at [redacted]w[redacted]d, here for a NST for pregnancy complicated by AMA and FGR.  She reports active fetal movement, contractions: none, vaginal bleeding: none, membranes: intact.   OBJECTIVE:  BP 135/80   Pulse 70   LMP  (LMP Unknown)   Appears well, no apparent distress  No results found for this or any previous visit (from the past 24 hour(s)).  NST: FHR baseline 120 bpm, Variability: moderate, Accelerations:present, Decelerations:  Absent= Cat 1/Reactive Toco: none   ASSESSMENT: [id number] at [redacted]w[redacted]d with AMA and FGR NST reactive  PLAN: EFM strip reviewed by Dr. Despina Hidden   Recommendations: keep next appointment as scheduled    [name]  05/23/20 1347  Attestation of Attending Supervision of Nursing Visit Encounter: Evaluation and management procedures were performed by the nursing staff under my supervision and collaboration.  I have reviewed the nurse's note and chart, and I agree with the management and plan.  Rockne Coons MD Attending Physician for the [facility] 05/26/2020 9:45 AM
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
## 2020-05-27 ENCOUNTER — Inpatient Hospital Stay (HOSPITAL_COMMUNITY): Payer: Medicaid Other | Admitting: Anesthesiology

## 2020-05-27 ENCOUNTER — Encounter (HOSPITAL_COMMUNITY): Payer: Self-pay | Admitting: Obstetrics & Gynecology

## 2020-05-27 ENCOUNTER — Encounter (HOSPITAL_COMMUNITY)
Admission: AD | Discharge status: Against Medical Advice | Payer: Self-pay | Source: Home / Self Care | Attending: Obstetrics & Gynecology

## 2020-05-27 DIAGNOSIS — O4403 Placenta previa specified as without hemorrhage, third trimester: Secondary | ICD-10-CM

## 2020-05-27 DIAGNOSIS — O34211 Maternal care for low transverse scar from previous cesarean delivery: Secondary | ICD-10-CM

## 2020-05-27 DIAGNOSIS — O326XX0 Maternal care for compound presentation, not applicable or unspecified: Secondary | ICD-10-CM

## 2020-05-27 DIAGNOSIS — Z302 Encounter for sterilization: Secondary | ICD-10-CM

## 2020-05-27 DIAGNOSIS — Z3A36 36 weeks gestation of pregnancy: Secondary | ICD-10-CM

## 2020-05-27 DIAGNOSIS — Z98891 History of uterine scar from previous surgery: Principal | ICD-10-CM

## 2020-05-27 LAB — CERVICOVAGINAL ANCILLARY ONLY
Chlamydia: NEGATIVE
Comment: NEGATIVE
Comment: NORMAL
Neisseria Gonorrhea: NEGATIVE

## 2020-05-27 LAB — CULTURE, BETA STREP (GROUP B ONLY): Strep Gp B Culture: NEGATIVE

## 2020-05-27 LAB — ABO/RH: ABO/RH(D): A NEG

## 2020-05-27 LAB — CBC
HCT: 28.8 % — ABNORMAL LOW (ref 36.0–46.0)
Hemoglobin: 9 g/dL — ABNORMAL LOW (ref 12.0–15.0)
MCH: 25.6 pg — ABNORMAL LOW (ref 26.0–34.0)
MCHC: 31.3 g/dL (ref 30.0–36.0)
MCV: 81.8 fL (ref 80.0–100.0)
Platelets: 181 10*3/uL (ref 150–400)
RBC: 3.52 MIL/uL — ABNORMAL LOW (ref 3.87–5.11)
RDW: 15 % (ref 11.5–15.5)
WBC: 8.9 10*3/uL (ref 4.0–10.5)
nRBC: 0 % (ref 0.0–0.2)

## 2020-05-27 LAB — RAPID HIV SCREEN (HIV 1/2 AB+AG)
HIV 1/2 Antibodies: NONREACTIVE
HIV-1 P24 Antigen - HIV24: NONREACTIVE

## 2020-05-27 LAB — RPR
RPR Ser Ql: NONREACTIVE
RPR Ser Ql: NONREACTIVE

## 2020-05-27 LAB — PREPARE RBC (CROSSMATCH)

## 2020-05-27 SURGERY — Surgical Case
Anesthesia: Spinal | Wound class: Clean Contaminated

## 2020-05-27 MED ORDER — FENTANYL CITRATE (PF) 100 MCG/2ML IJ SOLN
25.0000 ug | INTRAMUSCULAR | Status: DC | PRN
  Administered 2020-05-27: 50 ug via INTRAVENOUS

## 2020-05-27 MED ORDER — KETOROLAC TROMETHAMINE 30 MG/ML IJ SOLN
INTRAMUSCULAR | Status: AC
  Filled 2020-05-27: qty 1

## 2020-05-27 MED ORDER — OXYTOCIN-SODIUM CHLORIDE 30-0.9 UT/500ML-% IV SOLN
INTRAVENOUS | Status: DC | PRN
  Administered 2020-05-27: 30 [IU] via INTRAVENOUS

## 2020-05-27 MED ORDER — OXYCODONE HCL 5 MG PO TABS
5.0000 mg | ORAL_TABLET | ORAL | Status: DC | PRN
  Administered 2020-05-28: 5 mg via ORAL
  Filled 2020-05-27: qty 1

## 2020-05-27 MED ORDER — ZOLPIDEM TARTRATE 5 MG PO TABS: 5.0000 mg | ORAL_TABLET | Freq: Every evening | ORAL | Status: DC | PRN

## 2020-05-27 MED ORDER — PNEUMOCOCCAL VAC POLYVALENT 25 MCG/0.5ML IJ INJ
0.5000 mL | INJECTION | INTRAMUSCULAR | Status: DC | PRN
  Filled 2020-05-27: qty 0.5

## 2020-05-27 MED ORDER — LACTATED RINGERS IV SOLN: INTRAVENOUS | Status: DC

## 2020-05-27 MED ORDER — WITCH HAZEL-GLYCERIN EX PADS: 1.0000 "application " | MEDICATED_PAD | CUTANEOUS | Status: DC | PRN

## 2020-05-27 MED ORDER — ONDANSETRON HCL 4 MG/2ML IJ SOLN
INTRAMUSCULAR | Status: AC
  Filled 2020-05-27: qty 2

## 2020-05-27 MED ORDER — STERILE WATER FOR IRRIGATION IR SOLN
Status: DC | PRN
  Administered 2020-05-27: 1000 mL

## 2020-05-27 MED ORDER — PRENATAL MULTIVITAMIN CH
1.0000 | ORAL_TABLET | Freq: Every day | ORAL | Status: DC
  Administered 2020-05-28: 1 via ORAL
  Filled 2020-05-27: qty 1

## 2020-05-27 MED ORDER — MENTHOL 3 MG MT LOZG: 1.0000 | LOZENGE | OROMUCOSAL | Status: DC | PRN

## 2020-05-27 MED ORDER — ACETAMINOPHEN 10 MG/ML IV SOLN
1000.0000 mg | Freq: Once | INTRAVENOUS | Status: DC | PRN
  Administered 2020-05-27: 1000 mg via INTRAVENOUS

## 2020-05-27 MED ORDER — EPHEDRINE 5 MG/ML INJ
INTRAVENOUS | Status: AC
  Filled 2020-05-27: qty 30

## 2020-05-27 MED ORDER — SOD CITRATE-CITRIC ACID 500-334 MG/5ML PO SOLN
30.0000 mL | Freq: Once | ORAL | Status: AC
  Administered 2020-05-27: 30 mL via ORAL
  Filled 2020-05-27: qty 15

## 2020-05-27 MED ORDER — ORAL CARE MOUTH RINSE: 15.0000 mL | Freq: Once | OROMUCOSAL | Status: AC

## 2020-05-27 MED ORDER — ONDANSETRON HCL 4 MG/2ML IJ SOLN
INTRAMUSCULAR | Status: DC | PRN
  Administered 2020-05-27: 4 mg via INTRAVENOUS

## 2020-05-27 MED ORDER — SIMETHICONE 80 MG PO CHEW
80.0000 mg | CHEWABLE_TABLET | Freq: Three times a day (TID) | ORAL | Status: DC
  Administered 2020-05-27 – 2020-05-28 (×3): 80 mg via ORAL
  Filled 2020-05-27 (×3): qty 1

## 2020-05-27 MED ORDER — NICOTINE 14 MG/24HR TD PT24
14.0000 mg | MEDICATED_PATCH | Freq: Every day | TRANSDERMAL | Status: DC
  Administered 2020-05-27 – 2020-05-28 (×2): 14 mg via TRANSDERMAL
  Filled 2020-05-27 (×2): qty 1

## 2020-05-27 MED ORDER — FENTANYL CITRATE (PF) 100 MCG/2ML IJ SOLN
INTRAMUSCULAR | Status: DC | PRN
  Administered 2020-05-27: 15 ug via INTRATHECAL

## 2020-05-27 MED ORDER — SENNOSIDES-DOCUSATE SODIUM 8.6-50 MG PO TABS
2.0000 | ORAL_TABLET | ORAL | Status: DC
  Administered 2020-05-27 – 2020-05-28 (×2): 2 via ORAL
  Filled 2020-05-27 (×2): qty 2

## 2020-05-27 MED ORDER — ACETAMINOPHEN 10 MG/ML IV SOLN
INTRAVENOUS | Status: AC
  Filled 2020-05-27: qty 100

## 2020-05-27 MED ORDER — ACETAMINOPHEN 500 MG PO TABS
1000.0000 mg | ORAL_TABLET | Freq: Four times a day (QID) | ORAL | Status: DC
  Administered 2020-05-27 – 2020-05-28 (×3): 1000 mg via ORAL
  Filled 2020-05-27 (×3): qty 2

## 2020-05-27 MED ORDER — BUPIVACAINE IN DEXTROSE 0.75-8.25 % IT SOLN
INTRATHECAL | Status: DC | PRN
  Administered 2020-05-27: 1.7 mL via INTRATHECAL

## 2020-05-27 MED ORDER — FENTANYL CITRATE (PF) 100 MCG/2ML IJ SOLN
INTRAMUSCULAR | Status: AC
  Filled 2020-05-27: qty 2

## 2020-05-27 MED ORDER — SIMETHICONE 80 MG PO CHEW: 80.0000 mg | CHEWABLE_TABLET | ORAL | Status: DC | PRN

## 2020-05-27 MED ORDER — SODIUM CHLORIDE 0.9% IV SOLUTION: Freq: Once | INTRAVENOUS | Status: DC

## 2020-05-27 MED ORDER — TRANEXAMIC ACID-NACL 1000-0.7 MG/100ML-% IV SOLN
INTRAVENOUS | Status: DC | PRN
  Administered 2020-05-27: 1000 mg via INTRAVENOUS

## 2020-05-27 MED ORDER — BUPRENORPHINE HCL-NALOXONE HCL 2-0.5 MG SL SUBL
2.0000 | SUBLINGUAL_TABLET | Freq: Three times a day (TID) | SUBLINGUAL | Status: DC
  Administered 2020-05-27 – 2020-05-28 (×4): 2 via SUBLINGUAL
  Filled 2020-05-27 (×5): qty 2

## 2020-05-27 MED ORDER — COCONUT OIL OIL: 1.0000 "application " | TOPICAL_OIL | Status: DC | PRN

## 2020-05-27 MED ORDER — POVIDONE-IODINE 10 % EX SWAB: 2.0000 "application " | Freq: Once | CUTANEOUS | Status: DC

## 2020-05-27 MED ORDER — CEFAZOLIN SODIUM-DEXTROSE 2-4 GM/100ML-% IV SOLN: 2.0000 g | INTRAVENOUS | Status: DC

## 2020-05-27 MED ORDER — MORPHINE SULFATE (PF) 0.5 MG/ML IJ SOLN
INTRAMUSCULAR | Status: AC
  Filled 2020-05-27: qty 10

## 2020-05-27 MED ORDER — SODIUM CHLORIDE 0.9 % IR SOLN
Status: DC | PRN
  Administered 2020-05-27: 1000 mL

## 2020-05-27 MED ORDER — OXYTOCIN-SODIUM CHLORIDE 30-0.9 UT/500ML-% IV SOLN: 2.5000 [IU]/h | INTRAVENOUS | Status: DC

## 2020-05-27 MED ORDER — DIBUCAINE (PERIANAL) 1 % EX OINT: 1.0000 "application " | TOPICAL_OINTMENT | CUTANEOUS | Status: DC | PRN

## 2020-05-27 MED ORDER — KETOROLAC TROMETHAMINE 30 MG/ML IJ SOLN
30.0000 mg | Freq: Four times a day (QID) | INTRAMUSCULAR | Status: AC | PRN
  Administered 2020-05-27: 30 mg via INTRAVENOUS
  Filled 2020-05-27: qty 1

## 2020-05-27 MED ORDER — DIPHENHYDRAMINE HCL 25 MG PO CAPS: 25.0000 mg | ORAL_CAPSULE | Freq: Four times a day (QID) | ORAL | Status: DC | PRN

## 2020-05-27 MED ORDER — TRANEXAMIC ACID-NACL 1000-0.7 MG/100ML-% IV SOLN
INTRAVENOUS | Status: AC
  Filled 2020-05-27: qty 100

## 2020-05-27 MED ORDER — TETANUS-DIPHTH-ACELL PERTUSSIS 5-2.5-18.5 LF-MCG/0.5 IM SUSY: 0.5000 mL | PREFILLED_SYRINGE | Freq: Once | INTRAMUSCULAR | Status: DC

## 2020-05-27 MED ORDER — OXYTOCIN-SODIUM CHLORIDE 30-0.9 UT/500ML-% IV SOLN
INTRAVENOUS | Status: AC
  Filled 2020-05-27: qty 500

## 2020-05-27 MED ORDER — KETOROLAC TROMETHAMINE 30 MG/ML IJ SOLN
30.0000 mg | Freq: Four times a day (QID) | INTRAMUSCULAR | Status: AC | PRN
  Administered 2020-05-27: 30 mg via INTRAMUSCULAR

## 2020-05-27 MED ORDER — POVIDONE-IODINE 10 % EX SWAB
2.0000 "application " | Freq: Once | CUTANEOUS | Status: AC
  Administered 2020-05-27: 2 via TOPICAL

## 2020-05-27 MED ORDER — MEASLES, MUMPS & RUBELLA VAC IJ SOLR: 0.5000 mL | Freq: Once | INTRAMUSCULAR | Status: DC

## 2020-05-27 MED ORDER — MORPHINE SULFATE (PF) 0.5 MG/ML IJ SOLN
INTRAMUSCULAR | Status: DC | PRN
  Administered 2020-05-27: 150 ug via INTRATHECAL

## 2020-05-27 MED ORDER — ENOXAPARIN SODIUM 40 MG/0.4ML ~~LOC~~ SOLN: 40.0000 mg | SUBCUTANEOUS | Status: DC

## 2020-05-27 MED ORDER — SCOPOLAMINE 1 MG/3DAYS TD PT72: 1.0000 | MEDICATED_PATCH | Freq: Once | TRANSDERMAL | Status: DC

## 2020-05-27 MED ORDER — PHENYLEPHRINE HCL-NACL 20-0.9 MG/250ML-% IV SOLN
INTRAVENOUS | Status: AC
  Filled 2020-05-27: qty 500

## 2020-05-27 MED ORDER — CHLORHEXIDINE GLUCONATE 0.12 % MT SOLN
15.0000 mL | Freq: Once | OROMUCOSAL | Status: AC
  Administered 2020-05-27: 15 mL via OROMUCOSAL
  Filled 2020-05-27: qty 15

## 2020-05-27 MED ORDER — KETOROLAC TROMETHAMINE 30 MG/ML IJ SOLN: 30.0000 mg | Freq: Once | INTRAMUSCULAR | Status: DC

## 2020-05-27 MED ORDER — PHENYLEPHRINE HCL-NACL 20-0.9 MG/250ML-% IV SOLN
INTRAVENOUS | Status: DC | PRN
  Administered 2020-05-27: 60 ug/min via INTRAVENOUS

## 2020-05-27 SURGICAL SUPPLY — 34 items
BARRIER ADHS 3X4 INTERCEED (GAUZE/BANDAGES/DRESSINGS) IMPLANT
BENZOIN TINCTURE PRP APPL 2/3 (GAUZE/BANDAGES/DRESSINGS) ×2 IMPLANT
CHLORAPREP W/TINT 26ML (MISCELLANEOUS) ×2 IMPLANT
CLAMP CORD UMBIL (MISCELLANEOUS) IMPLANT
CLIP FILSHIE TUBAL LIGA STRL (Clip) ×4 IMPLANT
CLOSURE STERI STRIP 1/2 X4 (GAUZE/BANDAGES/DRESSINGS) ×2 IMPLANT
CLOTH BEACON ORANGE TIMEOUT ST (SAFETY) ×2 IMPLANT
DRSG OPSITE POSTOP 4X10 (GAUZE/BANDAGES/DRESSINGS) ×2 IMPLANT
ELECT REM PT RETURN 9FT ADLT (ELECTROSURGICAL) ×2
ELECTRODE REM PT RTRN 9FT ADLT (ELECTROSURGICAL) ×1 IMPLANT
EXTRACTOR VACUUM KIWI (MISCELLANEOUS) IMPLANT
GLOVE BIO SURGEON STRL SZ 6.5 (GLOVE) ×2 IMPLANT
GLOVE BIOGEL PI IND STRL 7.0 (GLOVE) ×2 IMPLANT
GLOVE BIOGEL PI INDICATOR 7.0 (GLOVE) ×2
GOWN STRL REUS W/TWL LRG LVL3 (GOWN DISPOSABLE) ×4 IMPLANT
KIT ABG SYR 3ML LUER SLIP (SYRINGE) IMPLANT
NEEDLE HYPO 22GX1.5 SAFETY (NEEDLE) IMPLANT
NEEDLE HYPO 25X5/8 SAFETYGLIDE (NEEDLE) IMPLANT
NS IRRIG 1000ML POUR BTL (IV SOLUTION) ×2 IMPLANT
PACK C SECTION WH (CUSTOM PROCEDURE TRAY) ×2 IMPLANT
PAD ABD 7.5X8 STRL (GAUZE/BANDAGES/DRESSINGS) ×2 IMPLANT
PAD OB MATERNITY 4.3X12.25 (PERSONAL CARE ITEMS) ×2 IMPLANT
PENCIL SMOKE EVAC W/HOLSTER (ELECTROSURGICAL) ×2 IMPLANT
RETRACTOR WND ALEXIS 25 LRG (MISCELLANEOUS) IMPLANT
RTRCTR WOUND ALEXIS 25CM LRG (MISCELLANEOUS)
SPONGE GAUZE 4X4 12PLY STER LF (GAUZE/BANDAGES/DRESSINGS) ×4 IMPLANT
SUT VIC AB 0 CT1 36 (SUTURE) ×12 IMPLANT
SUT VIC AB 2-0 CT1 27 (SUTURE) ×1
SUT VIC AB 2-0 CT1 TAPERPNT 27 (SUTURE) ×1 IMPLANT
SUT VIC AB 4-0 PS2 27 (SUTURE) ×2 IMPLANT
SYR CONTROL 10ML LL (SYRINGE) IMPLANT
TOWEL OR 17X24 6PK STRL BLUE (TOWEL DISPOSABLE) ×2 IMPLANT
TRAY FOLEY W/BAG SLVR 14FR LF (SET/KITS/TRAYS/PACK) IMPLANT
WATER STERILE IRR 1000ML POUR (IV SOLUTION) ×2 IMPLANT

## 2020-05-27 NOTE — Anesthesia Postprocedure Evaluation (Signed)
Anesthesia Post Note  Patient: Denise Conner  Procedure(s) Performed: CESAREAN SECTION (N/A )     Patient location during evaluation: PACU Anesthesia Type: Spinal Level of consciousness: oriented and awake and alert Pain management: pain level controlled Vital Signs Assessment: post-procedure vital signs reviewed and stable Respiratory status: spontaneous breathing and respiratory function stable Cardiovascular status: blood pressure returned to baseline and stable Postop Assessment: no headache, no backache, no apparent nausea or vomiting and spinal receding Anesthetic complications: no   No complications documented.  Last Vitals:  Vitals:   05/27/20 1630 05/27/20 1656  BP: 124/78 126/77  Pulse: 60 65  Resp: 16 17  Temp:  (!) 36.2 C  SpO2: 98% 97%    Last Pain:  Vitals:   05/27/20 1700  TempSrc:   PainSc: 9    Pain Goal: Patients Stated Pain Goal: 3 (05/27/20 1700)              Epidural/Spinal Function Cutaneous sensation: Able to Wiggle Toes (05/27/20 1700), Patient able to flex knees: Yes (05/27/20 1700), Patient able to lift hips off bed: Yes (05/27/20 1700), Back pain beyond tenderness at insertion site: No (05/27/20 1700), Progressively worsening motor and/or sensory loss: No (05/27/20 1700), Bowel and/or bladder incontinence post epidural: No (05/27/20 1700)  Mellody Dance

## 2020-05-27 NOTE — Lactation Note (Signed)
This note was copied from a baby's chart. Lactation Consultation Note  Patient Name: Denise Conner [id number] Date: 05/27/2020   Age:44 hours  Mom infant in the NICU. Mom COVID positive. LC had RN call into the room to see if Mom had a DEBP. Mom stated that she did not want to be seen by lactation and did not have a pump in the room.

## 2020-05-27 NOTE — Anesthesia Preprocedure Evaluation (Signed)
Anesthesia Evaluation  Patient identified by MRN, date of birth, ID band Patient awake    Reviewed: Allergy & Precautions, H&P , NPO status , Patient's Chart, lab work & pertinent test results  Airway Mallampati: II  TM Distance: >3 FB Neck ROM: Full    Dental no notable dental hx.    Pulmonary neg pulmonary ROS, Current Smoker,    Pulmonary exam normal breath sounds clear to auscultation       Cardiovascular Exercise Tolerance: Good negative cardio ROS Normal cardiovascular exam Rhythm:Regular Rate:Normal     Neuro/Psych PSYCHIATRIC DISORDERS Depression negative neurological ROS     GI/Hepatic negative GI ROS, (+)     substance abuse (on suboxone)  ,   Endo/Other  negative endocrine ROS  Renal/GU negative Renal ROS  negative genitourinary   Musculoskeletal  (+) Arthritis , narcotic dependent  Abdominal   Peds  Hematology  (+) anemia ,   Anesthesia Other Findings   Reproductive/Obstetrics negative OB ROS                             Anesthesia Physical Anesthesia Plan  ASA: III  Anesthesia Plan: Spinal   Post-op Pain Management:    Induction: Intravenous  PONV Risk Score and Plan: 1  Airway Management Planned: Natural Airway  Additional Equipment:   Intra-op Plan:   Post-operative Plan: Extubation in OR  Informed Consent: I have reviewed the patients History and Physical, chart, labs and discussed the procedure including the risks, benefits and alternatives for the proposed anesthesia with the patient or authorized representative who has indicated his/her understanding and acceptance.     Dental advisory given  Plan Discussed with: CRNA and Anesthesiologist  Anesthesia Plan Comments: (Blood available. + Covid precautions. Spinal. GETA as backup plan. Given suboxone use, would avoid nubain postop. Tanna Furry, MD  )        Anesthesia Quick Evaluation

## 2020-05-27 NOTE — Op Note (Signed)
Operative Report  PATIENT: Denise Conner  PROCEDURE DATE: 05/27/2020  PREOPERATIVE DIAGNOSES: Intrauterine pregnancy at [redacted]w[redacted]d weeks gestation; prior CS and placenta previa;  Undesired fertility  POSTOPERATIVE DIAGNOSES: The same  PROCEDURE: Repeat Low Transverse Cesarean Section; Bilateral Tubal Sterilization using Filshie clips  SURGEON:   Surgeon(s) and Role:    * Adam Phenix, MD - Primary - Attending   Marcy Siren, DO - OB Fellow  ASSISTANT:  Marcy Siren, DO - OB Fellow   INDICATIONS: Denise Conner is a 45 y.o. [phone] at [redacted]w[redacted]d here for cesarean section secondary to the indications listed under preoperative diagnoses; please see preoperative note for further details.  The risks of cesarean section were discussed with the patient including but were not limited to: bleeding which may require transfusion or reoperation; infection which may require antibiotics; injury to bowel, bladder, ureters or other surrounding organs; injury to the fetus; need for additional procedures including hysterectomy in the event of a life-threatening hemorrhage; placental abnormalities wth subsequent pregnancies, incisional problems, thromboembolic phenomenon and other postoperative/anesthesia complications.   The patient concurred with the proposed plan, giving informed written consent for the procedure.    FINDINGS:  Viable female infant in cephalic presentation with compound hand.  Apgars 8 and 9.  Clear amniotic fluid.  Intact placenta, three vessel cord.  Normal uterus, fallopian tubes and ovaries bilaterally.  ANESTHESIA: Spinal INTRAVENOUS FLUIDS: 1800 cc ESTIMATED BLOOD LOSS: 504 mL URINE OUTPUT:  100 ml SPECIMENS: Placenta sent to L&D COMPLICATIONS: None immediate  PROCEDURE IN DETAIL:  The patient preoperatively received intravenous antibiotics and had sequential compression devices applied to her lower extremities.  She was then taken to the operating room where spinal anesthesia was  administered and was found to be adequate. She was then placed in a dorsal supine position with a leftward tilt, and prepped and draped in a sterile manner.  A foley catheter was placed into her bladder and attached to constant gravity.    After an adequate timeout was performed, a Pfannenstiel skin incision was made with scalpel over her preexisting scar and carried through to the underlying layer of fascia. The fascia was incised in the midline, and this incision was extended bilaterally using the Mayo scissors.  Kocher clamps were applied to the superior aspect of the fascial incision and the underlying rectus muscles were dissected off bluntly.  A similar process was carried out on the inferior aspect of the fascial incision. The rectus muscles were separated in the midline bluntly and the peritoneum was entered bluntly. Attention was turned to the lower uterine segment where a low transverse hysterotomy was made with a scalpel and extended bilaterally bluntly.  The infant was successfully delivered, the cord was clamped and cut after one minute, and the infant was handed over to the awaiting neonatology team. Uterine massage was then administered, and the placenta delivered intact with a three-vessel cord. The uterus was then cleared of clots and debris.  The hysterotomy was closed with 0 Vicryl in a running locked fashion, and an imbricating layer was also placed with 0 Vicryl.  Figure-of-eight 0 Vicryl serosal stitches were placed to help with hemostasis.TXA was administered as well.  Attention was then turned to the fallopian tubes, and Filshie clips were placed about 3 cm from the cornua, with care given to incorporate the underlying mesosalpinx on both sides, allowing for bilateral tubal sterilization. The pelvis was cleared of all clot and debris. Hemostasis was confirmed on all surfaces.  The peritoneum was closed  with a 0 Vicryl running stitch. The fascia was then closed using 0 Vicryl in a running  fashion.  The subcutaneous layer was irrigated, then reapproximated with 2-0 plain gut running stitches. The skin was closed with a 4-0 Vicryl subcuticular stitch.   The patient tolerated the procedure well. Sponge, lap, instrument and needle counts were correct x 3.  She was taken to the recovery room in stable condition.     Disposition: PACU - hemodynamically stable.   Maternal Condition: stable   Marcy Siren, D.O. OB Fellow  05/27/2020, 3:41 PM

## 2020-05-27 NOTE — Transfer of Care (Signed)
Immediate Anesthesia Transfer of Care Note  Patient: Denise Conner  Procedure(s) Performed: CESAREAN SECTION (N/A )  Patient Location: PACU  Anesthesia Type:Spinal  Level of Consciousness: awake, alert  and oriented  Airway & Oxygen Therapy: Patient Spontanous Breathing  Post-op Assessment: Report given to RN and Post -op Vital signs reviewed and stable  Post vital signs: Reviewed and stable  Last Vitals:  Vitals Value Taken Time  BP 111/79 05/27/20 1545  Temp    Pulse 89 05/27/20 1546  Resp 17 05/27/20 1546  SpO2 97 % 05/27/20 1546  Vitals shown include unvalidated device data.  Last Pain:  Vitals:   05/27/20 1132  TempSrc: Oral  PainSc:          Complications: No complications documented.

## 2020-05-27 NOTE — Anesthesia Procedure Notes (Signed)
Spinal  Patient location during procedure: OR Start time: 05/27/2020 2:15 PM End time: 05/27/2020 2:20 PM Staffing Performed: anesthesiologist  Anesthesiologist: Mellody Dance, MD Preanesthetic Checklist Completed: patient identified, IV checked, risks and benefits discussed, surgical consent, monitors and equipment checked, pre-op evaluation and timeout performed Spinal Block Patient position: sitting Prep: DuraPrep Patient monitoring: cardiac monitor, continuous pulse ox and blood pressure Approach: midline Location: L3-4 Injection technique: single-shot Needle Needle type: Pencan  Needle gauge: 24 G Needle length: 9 cm Additional Notes Functioning IV was confirmed and monitors were applied. Sterile prep and drape, including hand hygiene and sterile gloves were used. The patient was positioned and the spine was prepped. The skin was anesthetized with lidocaine.  Free flow of clear CSF was obtained prior to injecting local anesthetic into the CSF.  The spinal needle aspirated freely following injection.  The needle was carefully withdrawn.  The patient tolerated the procedure well.

## 2020-05-27 NOTE — Progress Notes (Signed)
Patient is admitted for repeat cesarean section with placenta previa, requests sterilization. The risks of surgery were discussed with the patient including but were not limited to: bleeding which may require transfusion or reoperation; infection which may require antibiotics; injury to bowel, bladder, ureters or other surrounding organs; injury to the fetus; need for additional procedures including hysterectomy in the event of a life-threatening hemorrhage; formation of adhesions; placental abnormalities wth subsequent pregnancies; incisional problems; thromboembolic phenomenon and other postoperative/anesthesia complications. 45 y.o. [id number] with undesired fertility desires permanent sterilization. Risks and benefits of procedure discussed with patient including permanence of method, bleeding, infection, injury to surrounding organs and need for additional procedures. Risk failure of 0.5-1% with increased risk of ectopic gestation if pregnancy occurs was also discussed with patient.   The patient concurred with the proposed plan, giving informed written consent for the procedure.   Patient has been NPO since 0000 she will remain NPO for procedure. Anesthesia and OR aware. Preoperative prophylactic antibiotics and SCDs ordered on call to the OR.  To OR when ready.  Adam Phenix, MD 05/27/2020 12:07 PM  .

## 2020-05-27 NOTE — Discharge Summary (Signed)
Postpartum Discharge Summary  PATIENT LEFT AGAINST MEDICAL ADVICE    Patient Name: Denise Conner DOB: [birth year] MRN: [id number]  Date of admission: 05/26/2020 Delivery date:05/27/2020  Delivering provider: Woodroe Mode  Date of discharge: 05/28/2020  Admitting diagnosis: Complete placenta previa nos or without hemorrhage, third trimester [O44.03] S/P cesarean section [Z98.891] Intrauterine pregnancy: [redacted]w[redacted]d    Secondary diagnosis:  Active Problems:   AMA (advanced maternal age) multigravida 35+   Opioid abuse ([place]   Suboxone maintenance treatment complicating pregnancy, antepartum ([place]   Rh negative state in antepartum period   Placenta previa   Previous cesarean section   Complete placenta previa nos or without hemorrhage, third trimester   S/P cesarean section   COVID-19  Additional problems: Fetal Growth Restriction, 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 yo G207 [id number] admitted to the hospital 05/26/2020 for scheduled cesarean section with the following indication:history of prior CS x2, placenta previa , new onset gHTNDelivery details are as follows:  Membrane Rupture Time/Date: 2:40 PM ,05/27/2020   Delivery Method:C-Section, Low Transverse  Details of operation can be found in separate operative note.  Patient had an uncomplicated postpartum course with the exception of elevated BP and was started on Norvasc on HD#1.  She is ambulating, tolerating a regular diet, passing flatus, and urinating well. Patient desires to leave on POD#1, recommended against this as her BP needs to be monitored. Reviewed risks of uncontrolled hypertension including stroke, heart attack, death, long term sequela. Patient has social situation (husband had  stroke and is being released from hospital today) that she feels she must leave for. She verbalizes and acknowledges understanding of the above and desires to leave AMA. Will be given norvasc and instructions, importance of following up in office for BP check, post partum check. Reviewed reasons to return to MAU. She continues to report she is asymptomatic from a COVID perspective, reviewed importance of reasons to return. Patient signed paperwork with RN, left AMA.         Newborn Data: Birth date:05/27/2020  Birth time:2:40 PM  Gender:Female  Living status:Living  Apgars:8 ,9  Weight:1940 g     Magnesium Sulfate received: No BMZ received: No Rhophylac:No MMR:No  Transfusion:No  Physical exam  Vitals:   05/28/20 0710 05/28/20 0715 05/28/20 0720 05/28/20 0809  BP:    (!) 143/73  Pulse:    73  Resp:    17  Temp:    97.8 F (36.6 C)  TempSrc:    Oral  SpO2: 99% 99% 100% 99%  Weight:       General: alert, cooperative and no distress Lochia: appropriate Uterine Fundus: firm Incision: Dressing is clean, dry, and intact DVT Evaluation: No evidence of DVT seen on physical exam. Labs: Lab Results  Component Value Date   WBC 10.8 (H) 05/28/2020   HGB 8.9 (L) 05/28/2020   HCT 28.8 (L) 05/28/2020   MCV 82.3 05/28/2020   PLT 244 05/28/2020   CMP Latest Ref Rng & Units 05/28/2020  Glucose 70 - 99 mg/dL -  BUN 6 - 20 mg/dL -  Creatinine 0.44 - 1.00 mg/dL 0.51  Sodium 135 - 145 mmol/L -  Potassium 3.5 - 5.1 mmol/L -  Chloride 98 - 111 mmol/L -  CO2 22 - 32 mmol/L -  Calcium 8.9 - 10.3 mg/dL -  Total Protein 6.5 - 8.1 g/dL -  Total Bilirubin 0.3 - 1.2 mg/dL -  Alkaline Phos 38 - 126 U/L -  AST 15 - 41 U/L -  ALT 0 - 44 U/L -   Edinburgh Score: Edinburgh Postnatal Depression Scale Screening Tool 05/28/2020  I have been able to laugh and see the funny side of things. 0  I have looked forward with enjoyment to things. 0  I have blamed myself unnecessarily when things went  wrong. 0  I have been anxious or worried for no good reason. 0  I have felt scared or panicky for no good reason. 0  Things have been getting on top of me. 1  I have been so unhappy that I have had difficulty sleeping. 0  I have felt sad or miserable. 0  I have been so unhappy that I have been crying. 0  The thought of harming myself has occurred to me. 0  Edinburgh Postnatal Depression Scale Total 1    Discharge home in stable condition Infant Feeding: BTL done PP Infant Disposition:NICU Discharge instruction: per After Visit Summary and Postpartum booklet. Activity: Advance as tolerated. Pelvic rest for 6 weeks.  Diet: routine diet Future Appointments:No future appointments. Follow up Visit:   Please schedule this patient for a In person postpartum visit in 4 weeks with the following provider: MD. Additional Postpartum F/U:BP check 1 week  High risk pregnancy complicated by: HTN and no prenatal care Delivery mode:  C-Section, Low Transverse  Anticipated Birth Control:  BTL done [facility]   05/28/2020 Sloan Leiter, MD

## 2020-05-28 ENCOUNTER — Other Ambulatory Visit (HOSPITAL_COMMUNITY): Payer: Self-pay | Admitting: Obstetrics and Gynecology

## 2020-05-28 ENCOUNTER — Inpatient Hospital Stay (HOSPITAL_COMMUNITY)
Admission: RE | Admit: 2020-05-28 | Payer: Medicaid Other | Source: Home / Self Care | Admitting: Obstetrics & Gynecology

## 2020-05-28 ENCOUNTER — Encounter (HOSPITAL_COMMUNITY): Admission: RE | Payer: Self-pay | Source: Home / Self Care

## 2020-05-28 DIAGNOSIS — U071 COVID-19: Secondary | ICD-10-CM | POA: Diagnosis present

## 2020-05-28 LAB — CBC
HCT: 28.8 % — ABNORMAL LOW (ref 36.0–46.0)
Hemoglobin: 8.9 g/dL — ABNORMAL LOW (ref 12.0–15.0)
MCH: 25.4 pg — ABNORMAL LOW (ref 26.0–34.0)
MCHC: 30.9 g/dL (ref 30.0–36.0)
MCV: 82.3 fL (ref 80.0–100.0)
Platelets: 244 10*3/uL (ref 150–400)
RBC: 3.5 MIL/uL — ABNORMAL LOW (ref 3.87–5.11)
RDW: 14.9 % (ref 11.5–15.5)
WBC: 10.8 10*3/uL — ABNORMAL HIGH (ref 4.0–10.5)
nRBC: 0 % (ref 0.0–0.2)

## 2020-05-28 LAB — CREATININE, SERUM
Creatinine, Ser: 0.51 mg/dL (ref 0.44–1.00)
GFR, Estimated: >60 mL/min (ref >60)

## 2020-05-28 SURGERY — Surgical Case
Anesthesia: General

## 2020-05-28 MED ORDER — AMLODIPINE BESYLATE 5 MG PO TABS
5.0000 mg | ORAL_TABLET | Freq: Every day | ORAL | Status: DC
Start: 2020-05-28 — End: 2020-05-28
  Administered 2020-05-28: 5 mg via ORAL
  Filled 2020-05-28: qty 1

## 2020-05-28 MED ORDER — AMLODIPINE BESYLATE 5 MG PO TABS: 5.0000 mg | ORAL_TABLET | Freq: Every day | ORAL | 2 refills | Status: DC

## 2020-05-28 MED ORDER — SODIUM CHLORIDE 0.9 % IV SOLN
300.0000 mg | Freq: Once | INTRAVENOUS | Status: AC
  Administered 2020-05-28: 300 mg via INTRAVENOUS
  Filled 2020-05-28: qty 15

## 2020-05-28 MED FILL — AMLODIPINE BESYLATE 5 MG TA: 5 | 30 days supply | Qty: 30 | Fill #0
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
## 2020-05-28 NOTE — Progress Notes (Addendum)
Faculty Attending Note  Post Op Day 1  Subjective: Patient is feeling very well. She reports well controlled pain on PO pain meds. She is ambulating and denies light-headedness or dizziness. She is voiding spontaneously. She is  passing flatus, has not had a BM yet. She is tolerating a regular diet without nausea/vomiting. Bleeding is moderate. She is bottle feeding. Baby is in NICU and doing well.  Objective: Blood pressure (!) 143/73, pulse 73, temperature 97.8 F (36.6 C), temperature source Oral, resp. rate 17, weight 89 kg, SpO2 99 %, unknown if currently breastfeeding. Temp:  [96.5 F (35.8 C)-98.4 F (36.9 C)] 97.8 F (36.6 C) 17-Jun-2022 0809) Pulse Rate:  [58-81] 73 June 17, 2022 0809) Resp:  [12-20] 17 06/17/2022 0809) BP: (111-143)/(62-84) 143/73 06/17/2022 0809) SpO2:  [94 %-100 %] 99 % 06/17/2022 0809)  Physical Exam:  General: alert, oriented, cooperative Chest: normal respiratory effort Heart: RRR  Abdomen: soft, appropriately tender to palpation, incision covered by dressing with no evidence of active bleeding  Uterine Fundus: firm, 2 fingers below the umbilicus Lochia: moderate, rubra DVT Evaluation: no evidence of DVT Extremities: no edema, no calf tenderness  UOP: voiding spontaneously  Recent Labs    05/27/20 0728 05/28/20 0657  HGB 9.0* 8.9*  HCT 28.8* 28.8*    Assessment/Plan: Patient Active Problem List   Diagnosis Date Noted  . S/P cesarean section 05/27/2020  . Complete placenta previa nos or without hemorrhage, third trimester 05/26/2020  . IUGR (intrauterine growth restriction) affecting care of mother 05/12/2020  . Hepatitis C 05/07/2020  . Placenta previa 03/21/2020  . Previous cesarean section 03/21/2020  . UTI (urinary tract infection) during pregnancy, second trimester 03/21/2020  . Supervision of high-risk pregnancy 03/19/2020  . Suboxone maintenance treatment complicating pregnancy, antepartum (HCC) 03/19/2020  . Rh negative state in antepartum period  03/19/2020  . Late prenatal care affecting pregnancy in second trimester 03/06/2020  . AMA (advanced maternal age) multigravida 35+ 03/06/2020  . Opioid abuse (HCC) 03/06/2020  . Smoker 03/06/2020  . Major depressive disorder, single episode 11/15/2016  . Varicose veins 10/03/2013    Patient is 45 y.o. [id number] POD#2 s/p RCS at [redacted]w[redacted]d for complete posterior previa. Course complicated by small infant who is in NICU and patient symptomatic COVID with cough. She is doing very well, recovering appropriately and complains only of minimal pain. Would like to be discharged home today. Has a couple of elevated BP, started on norvasc. Will monitor BP today and consider late discharge home as patient very much desires dc home, otherwise, possible dc tomorrow pending BP. Also COVID positive but completely asymptomatic.   Start norvasc Cont suboxone venofer ordered Continue routine post partum care Pain meds prn Regular diet Lovenox 40 mg daily [place] S/p BTL for birth control Plan for discharge tomorrow   K. Therese Sarah, MD, [facility] Attending Center for [employer] (Faculty Practice)  05/28/2020, 12:59 PM

## 2020-05-29 ENCOUNTER — Other Ambulatory Visit: Payer: Medicaid Other

## 2020-05-29 LAB — TYPE AND SCREEN
ABO/RH(D): A NEG
Antibody Screen: NEGATIVE
Unit division: 0
Unit division: 0

## 2020-05-29 LAB — BPAM RBC
Blood Product Expiration Date: 202202032359
Blood Product Expiration Date: 202202032359
Unit Type and Rh: 600
Unit Type and Rh: 600

## 2020-06-03 ENCOUNTER — Inpatient Hospital Stay (HOSPITAL_COMMUNITY)
Admission: AD | Admit: 2020-06-03 | Discharge: 2020-06-04 | Disposition: A | Discharge status: Home / Self Care | Payer: Medicaid Other | Attending: Obstetrics and Gynecology | Admitting: Obstetrics and Gynecology

## 2020-06-03 ENCOUNTER — Other Ambulatory Visit: Payer: Self-pay

## 2020-06-03 ENCOUNTER — Encounter (HOSPITAL_COMMUNITY): Payer: Self-pay | Admitting: Obstetrics and Gynecology

## 2020-06-03 ENCOUNTER — Telehealth (INDEPENDENT_AMBULATORY_CARE_PROVIDER_SITE_OTHER): Payer: Medicaid Other | Admitting: *Deleted

## 2020-06-03 VITALS — BP 156/90 | HR 101

## 2020-06-03 DIAGNOSIS — O165 Unspecified maternal hypertension, complicating the puerperium: Secondary | ICD-10-CM

## 2020-06-03 DIAGNOSIS — R519 Headache, unspecified: Secondary | ICD-10-CM

## 2020-06-03 DIAGNOSIS — F17210 Nicotine dependence, cigarettes, uncomplicated: Secondary | ICD-10-CM | POA: Insufficient documentation

## 2020-06-03 DIAGNOSIS — R030 Elevated blood-pressure reading, without diagnosis of hypertension: Secondary | ICD-10-CM | POA: Diagnosis present

## 2020-06-03 DIAGNOSIS — O135 Gestational [pregnancy-induced] hypertension without significant proteinuria, complicating the puerperium: Secondary | ICD-10-CM | POA: Diagnosis not present

## 2020-06-03 DIAGNOSIS — Z0130 Encounter for examination of blood pressure without abnormal findings: Secondary | ICD-10-CM

## 2020-06-03 DIAGNOSIS — O99335 Smoking (tobacco) complicating the puerperium: Secondary | ICD-10-CM | POA: Insufficient documentation

## 2020-06-03 DIAGNOSIS — O99893 Other specified diseases and conditions complicating puerperium: Secondary | ICD-10-CM

## 2020-06-03 DIAGNOSIS — Z659 Problem related to unspecified psychosocial circumstances: Secondary | ICD-10-CM

## 2020-06-03 HISTORY — DX: Inflammatory liver disease, unspecified: K75.9

## 2020-06-03 MED ORDER — METOCLOPRAMIDE HCL 10 MG PO TABS
10.0000 mg | ORAL_TABLET | Freq: Once | ORAL | Status: AC
  Administered 2020-06-03: 10 mg via ORAL
  Filled 2020-06-03: qty 1

## 2020-06-03 NOTE — MAU Note (Addendum)
I was at the doctor's office today and my b/p was high. THe doctor wanted me to come in but I had to wait until I could get a babysitter. Pt seems very sleepy and is slumped over somewhat in Triage. States she drove herself to the hospital. Had repeat c/s on 05/27/20. Responds appropriately to questions. Reports having a h/a for past 3 days and took 6 Goody Powders today which eased h/a alittle.
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
## 2020-06-03 NOTE — Progress Notes (Addendum)
   NURSE VISIT- BLOOD PRESSURE CHECK  SUBJECTIVE:  Denise Conner is a 45 y.o. [phone] female here for BP check. She is postpartum, delivery date 05/27/20    HYPERTENSION ROS:  Postpartum:  . Severe headaches that don't go away with tylenol/other medicines: Yes  . Visual changes (seeing spots/double/blurred vision) Yes  . Severe pain under right breast breast or in center of upper chest No  . Severe nausea/vomiting No  . Taking medicines as instructed yes  OBJECTIVE:  BP (!) 149/98 (BP Location: Left Arm, Patient Position: Sitting, Cuff Size: Normal)   Pulse (!) 101   LMP  (LMP Unknown)   Appearance alert, well appearing, and in no distress and oriented to person, place, and time.  ASSESSMENT: Postpartum  blood pressure check  PLAN: Discussed with Denise Conner, CNM, Denise Conner   Recommendations: Sent to [facility] for evaluation. Provider notified.   Follow-up: as scheduled   Denise Conner  06/03/2020 4:48 PM   Chart reviewed for nurse visit. Agree with plan of care. Pt w/ unrelenting headache x 3d not responding to otc meds. Will send to [facility] for pp pre-e eval.  Denise Conner, CNM 06/04/2020 1:14 PM

## 2020-06-04 LAB — CBC
HCT: 29.5 % — ABNORMAL LOW (ref 36.0–46.0)
Hemoglobin: 9.3 g/dL — ABNORMAL LOW (ref 12.0–15.0)
MCH: 26.7 pg (ref 26.0–34.0)
MCHC: 31.5 g/dL (ref 30.0–36.0)
MCV: 84.8 fL (ref 80.0–100.0)
Platelets: 330 10*3/uL (ref 150–400)
RBC: 3.48 MIL/uL — ABNORMAL LOW (ref 3.87–5.11)
RDW: 16.7 % — ABNORMAL HIGH (ref 11.5–15.5)
WBC: 9.8 10*3/uL (ref 4.0–10.5)
nRBC: 0 % (ref 0.0–0.2)

## 2020-06-04 LAB — COMPREHENSIVE METABOLIC PANEL
ALT: 26 U/L (ref 0–44)
AST: 31 U/L (ref 15–41)
Albumin: 2.7 g/dL — ABNORMAL LOW (ref 3.5–5.0)
Alkaline Phosphatase: 73 U/L (ref 38–126)
Anion gap: 12 (ref 5–15)
BUN: 8 mg/dL (ref 6–20)
CO2: 19 mmol/L — ABNORMAL LOW (ref 22–32)
Calcium: 8.2 mg/dL — ABNORMAL LOW (ref 8.9–10.3)
Chloride: 106 mmol/L (ref 98–111)
Creatinine, Ser: 0.45 mg/dL (ref 0.44–1.00)
GFR, Estimated: >60 mL/min (ref >60)
Glucose, Bld: 98 mg/dL (ref 70–99)
Potassium: 3.2 mmol/L — ABNORMAL LOW (ref 3.5–5.1)
Sodium: 137 mmol/L (ref 135–145)
Total Bilirubin: 0.3 mg/dL (ref 0.3–1.2)
Total Protein: 5.9 g/dL — ABNORMAL LOW (ref 6.5–8.1)

## 2020-06-04 LAB — PROTEIN / CREATININE RATIO, URINE
Creatinine, Urine: 110.34 mg/dL
Protein Creatinine Ratio: 0.09 mg/mg{Cre} (ref 0.00–0.15)
Total Protein, Urine: 10 mg/dL

## 2020-06-04 MED ORDER — METOCLOPRAMIDE HCL 5 MG PO TABS: 5.0000 mg | ORAL_TABLET | Freq: Four times a day (QID) | ORAL | 0 refills | Status: AC | PRN

## 2020-06-04 NOTE — Discharge Instructions (Signed)
Hypertension During Pregnancy High blood pressure (hypertension) is when the force of blood pumping through the arteries is high enough to cause problems with your health. Arteries are blood vessels that carry blood from the heart throughout the body. Hypertension during pregnancy can cause problems for you and your baby. It can be mild or severe. There are different types of hypertension that can happen during pregnancy. These include:  Chronic hypertension. This happens when you had high blood pressure before you became pregnant, and it continues during the pregnancy. Hypertension that develops before you are [redacted] weeks pregnant and continues during the pregnancy is also called chronic hypertension. If you have chronic hypertension, it will not go away after you have your baby. You will need follow-up visits with your health care provider after you have your baby. Your health care provider may want you to keep taking medicine for your blood pressure.  Gestational hypertension. This is hypertension that develops after the 20th week of pregnancy. Gestational hypertension usually goes away after you have your baby, but your health care provider will need to monitor your blood pressure to make sure that it is getting better.  Postpartum hypertension. This is high blood pressure that was present before delivery and continues after delivery or that starts after delivery. This usually occurs within 48 hours after childbirth but may occur up to 6 weeks after giving birth. When hypertension during pregnancy is severe, it is a medical emergency that requires treatment right away. How does this affect me? Women who have hypertension during pregnancy have a greater chance of developing hypertension later in life or during future pregnancies. In some cases, hypertension during pregnancy can cause serious complications, such as:  Stroke.  Heart attack.  Injury to other organs, such as kidneys, lungs, or  liver.  Preeclampsia.  A condition called hemolysis, elevated liver enzymes, and low platelet count (HELLP) syndrome.  Convulsions or seizures.  Placental abruption. How does this affect my baby? Hypertension during pregnancy can affect your baby. Your baby may:  Be born early (prematurely).  Not weigh as much as he or she should at birth (low birth weight).  Not tolerate labor well, leading to an unplanned cesarean delivery. This condition may also result in a baby's death before birth (stillbirth). What are the risks? There are certain factors that make it more likely for you to develop hypertension during pregnancy. These include:  Having hypertension during a previous pregnancy or a family history of hypertension.  Being overweight.  Being age 35 or older.  Being pregnant for the first time.  Being pregnant with more than one baby.  Becoming pregnant using fertilization methods, such as IVF (in vitro fertilization).  Having other medical problems, such as diabetes, kidney disease, or lupus. What can I do to lower my risk? The exact cause of hypertension during pregnancy is not known. You may be able to lower your risk by:  Maintaining a healthy weight.  Eating a healthy and balanced diet.  Following your health care provider's instructions about treating any long-term conditions that you had before becoming pregnant. It is very important to keep all of your prenatal care appointments. Your health care provider will check your blood pressure and make sure that your pregnancy is progressing as expected. If a problem is found, early treatment can prevent complications.   How is this treated? Treatment for hypertension during pregnancy varies depending on the type of hypertension you have and how serious it is.  If you were   taking medicine for high blood pressure before you became pregnant, talk with your health care provider. You may need to change medicine during  pregnancy because some medicines, like ACE inhibitors, may not be considered safe for your baby.  If you have gestational hypertension, your health care provider may order medicine to treat this during pregnancy.  If you are at risk for preeclampsia, your health care provider may recommend that you take a low-dose aspirin during your pregnancy.  If you have severe hypertension, you may need to be hospitalized so you and your baby can be monitored closely. You may also need to be given medicine to lower your blood pressure.  In some cases, if your condition gets worse, you may need to deliver your baby early. Follow these instructions at home: Eating and drinking  Drink enough fluid to keep your urine pale yellow.  Avoid caffeine.   Lifestyle  Do not use any products that contain nicotine or tobacco. These products include cigarettes, chewing tobacco, and vaping devices, such as e-cigarettes. If you need help quitting, ask your health care provider.  Do not use alcohol or drugs.  Avoid stress as much as possible.  Rest and get plenty of sleep.  Regular exercise can help to reduce your blood pressure. Ask your health care provider what kinds of exercise are best for you. General instructions  Take over-the-counter and prescription medicines only as told by your health care provider.  Keep all prenatal and follow-up visits. This is important. Contact a health care provider if:  You have symptoms that your health care provider told you may require more treatment or monitoring, such as: ? Headaches. ? Nausea or vomiting. ? Abdominal pain. ? Dizziness. ? Light-headedness. Get help right away if:  You have symptoms of serious complications, such as: ? Severe abdominal pain that does not get better with treatment. ? A severe headache that does not get better, blurred vision, or double vision. ? Vomiting that does not get better. ? Sudden, rapid weight gain or swelling in your  hands, ankles, or face. ? Vaginal bleeding. ? Blood in your urine. ? Shortness of breath or chest pain. ? Weakness on one side of your body or difficulty speaking.  Your baby is not moving as much as usual. These symptoms may represent a serious problem that is an emergency. Do not wait to see if the symptoms will go away. Get medical help right away. Call your local emergency services (911 in the [place] Do not drive yourself to the hospital. Summary  Hypertension during pregnancy can cause problems for you and your baby.  Treatment for hypertension during pregnancy varies depending on the type of hypertension you have and how serious it is.  Keep all prenatal and follow-up visits. This is important.  Get help right away if you have symptoms of serious complications related to high blood pressure. This information is not intended to replace advice given to you by your health care provider. Make sure you discuss any questions you have with your health care provider. Document Revised: 01/24/2020 Document Reviewed: 01/24/2020 [employer] Patient Education  2021 [employer]  

## 2020-07-14 ENCOUNTER — Other Ambulatory Visit: Payer: Self-pay

## 2020-07-14 ENCOUNTER — Telehealth: Payer: Self-pay

## 2020-07-14 NOTE — Telephone Encounter (Signed)
Returned pt's call for clarification. Pt has a new prescription written on 05/28/20 from Dr. Leroy Libman with 2 refills. Pt never picked those up. Instructed her to call that pharmacy and have them fill it or transfer to another pharmacy.

## 2020-07-14 NOTE — Telephone Encounter (Signed)
Pt called requesting a med refill for her BP medication

## 2020-07-16 ENCOUNTER — Ambulatory Visit: Payer: Medicaid Other | Admitting: Advanced Practice Midwife

## 2020-11-15 ENCOUNTER — Other Ambulatory Visit: Payer: Self-pay

## 2020-11-15 ENCOUNTER — Encounter (HOSPITAL_COMMUNITY): Payer: Self-pay | Admitting: *Deleted

## 2020-11-15 ENCOUNTER — Emergency Department (HOSPITAL_COMMUNITY)
Admission: EM | Admit: 2020-11-15 | Discharge: 2020-11-15 | Disposition: A | Discharge status: Home / Self Care | Payer: Medicaid Other | Attending: Emergency Medicine | Admitting: Emergency Medicine

## 2020-11-15 DIAGNOSIS — S81819A Laceration without foreign body, unspecified lower leg, initial encounter: Secondary | ICD-10-CM | POA: Insufficient documentation

## 2020-11-15 DIAGNOSIS — S8990XA Unspecified injury of unspecified lower leg, initial encounter: Secondary | ICD-10-CM | POA: Diagnosis present

## 2020-11-15 DIAGNOSIS — Z5321 Procedure and treatment not carried out due to patient leaving prior to being seen by health care provider: Secondary | ICD-10-CM | POA: Diagnosis not present

## 2020-11-15 DIAGNOSIS — W268XXA Contact with other sharp object(s), not elsewhere classified, initial encounter: Secondary | ICD-10-CM | POA: Diagnosis not present

## 2020-11-15 NOTE — ED Triage Notes (Signed)
States she nicked herself shaving her leg and cannot control the bleeding, bandaged on arrival

## 2022-06-17 DEATH — deceased
